# Patient Record
Sex: Female | Born: 1950 | Race: White | Hispanic: No | Marital: Married | Smoking: Never smoker
Health system: Southern US, Community
[De-identification: ages and names within clinical notes are randomized; demographics above are authoritative.]

## PROBLEM LIST (undated history)

## (undated) DIAGNOSIS — M1990 Unspecified osteoarthritis, unspecified site: Secondary | ICD-10-CM

## (undated) DIAGNOSIS — I739 Peripheral vascular disease, unspecified: Secondary | ICD-10-CM

## (undated) DIAGNOSIS — J45909 Unspecified asthma, uncomplicated: Secondary | ICD-10-CM

## (undated) DIAGNOSIS — R51 Headache: Secondary | ICD-10-CM

## (undated) DIAGNOSIS — R519 Headache, unspecified: Secondary | ICD-10-CM

## (undated) HISTORY — PX: TONSILLECTOMY: SUR1361

## (undated) HISTORY — PX: OTHER SURGICAL HISTORY: SHX169

## (undated) HISTORY — PX: WISDOM TOOTH EXTRACTION: SHX21

---

## 2004-08-25 ENCOUNTER — Encounter (INDEPENDENT_AMBULATORY_CARE_PROVIDER_SITE_OTHER): Payer: Self-pay | Admitting: Internal Medicine

## 2004-08-25 LAB — CONVERTED CEMR LAB: Pap Smear: NORMAL

## 2005-05-08 ENCOUNTER — Ambulatory Visit: Payer: Self-pay | Admitting: Family Medicine

## 2005-07-15 ENCOUNTER — Ambulatory Visit: Payer: Self-pay | Admitting: Family Medicine

## 2005-08-13 ENCOUNTER — Ambulatory Visit: Payer: Self-pay | Admitting: Family Medicine

## 2005-08-25 ENCOUNTER — Encounter (INDEPENDENT_AMBULATORY_CARE_PROVIDER_SITE_OTHER): Payer: Self-pay | Admitting: Internal Medicine

## 2005-08-25 LAB — CONVERTED CEMR LAB: Pap Smear: NORMAL

## 2005-08-29 ENCOUNTER — Ambulatory Visit: Payer: Self-pay | Admitting: Family Medicine

## 2005-08-29 ENCOUNTER — Other Ambulatory Visit: Admission: RE | Admit: 2005-08-29 | Discharge: 2005-08-29 | Payer: Self-pay | Admitting: Family Medicine

## 2005-08-29 ENCOUNTER — Encounter: Payer: Self-pay | Admitting: Family Medicine

## 2005-09-04 ENCOUNTER — Ambulatory Visit: Payer: Self-pay | Admitting: Family Medicine

## 2005-09-19 ENCOUNTER — Encounter: Admission: RE | Admit: 2005-09-19 | Discharge: 2005-09-19 | Payer: Self-pay | Admitting: Family Medicine

## 2005-12-16 ENCOUNTER — Ambulatory Visit: Payer: Self-pay | Admitting: Family Medicine

## 2006-01-08 ENCOUNTER — Ambulatory Visit: Payer: Self-pay | Admitting: Family Medicine

## 2006-05-14 ENCOUNTER — Ambulatory Visit: Payer: Self-pay | Admitting: Family Medicine

## 2006-05-26 ENCOUNTER — Ambulatory Visit: Payer: Self-pay | Admitting: Family Medicine

## 2006-06-02 ENCOUNTER — Ambulatory Visit: Payer: Self-pay | Admitting: Internal Medicine

## 2006-10-24 ENCOUNTER — Encounter (INDEPENDENT_AMBULATORY_CARE_PROVIDER_SITE_OTHER): Payer: Self-pay | Admitting: Internal Medicine

## 2006-10-24 LAB — CONVERTED CEMR LAB: Pap Smear: NORMAL

## 2006-10-28 ENCOUNTER — Encounter: Payer: Self-pay | Admitting: Family Medicine

## 2006-10-28 ENCOUNTER — Other Ambulatory Visit: Admission: RE | Admit: 2006-10-28 | Discharge: 2006-10-28 | Payer: Self-pay | Admitting: Family Medicine

## 2006-10-28 ENCOUNTER — Ambulatory Visit: Payer: Self-pay | Admitting: Family Medicine

## 2006-10-28 LAB — CONVERTED CEMR LAB
ALT: 41 units/L — ABNORMAL HIGH (ref 0–40)
AST: 27 units/L (ref 0–37)
Albumin: 3.3 g/dL — ABNORMAL LOW (ref 3.5–5.2)
Alkaline Phosphatase: 106 units/L (ref 39–117)
BUN: 12 mg/dL (ref 6–23)
Basophils Absolute: 0 10*3/uL (ref 0.0–0.1)
Basophils Relative: 0.5 % (ref 0.0–1.0)
Bilirubin, Direct: 0.1 mg/dL (ref 0.0–0.3)
CO2: 30 meq/L (ref 19–32)
Calcium: 8.9 mg/dL (ref 8.4–10.5)
Chloride: 107 meq/L (ref 96–112)
Creatinine, Ser: 0.7 mg/dL (ref 0.4–1.2)
Direct LDL: 111.1 mg/dL
Eosinophils Absolute: 0.1 10*3/uL (ref 0.0–0.6)
Eosinophils Relative: 1.2 % (ref 0.0–5.0)
GFR calc Af Amer: 112 mL/min
GFR calc non Af Amer: 92 mL/min
Glucose, Bld: 102 mg/dL — ABNORMAL HIGH (ref 70–99)
HCT: 40.8 % (ref 36.0–46.0)
Hemoglobin: 14.1 g/dL (ref 12.0–15.0)
Lymphocytes Relative: 25.6 % (ref 12.0–46.0)
MCHC: 34.6 g/dL (ref 30.0–36.0)
MCV: 87.1 fL (ref 78.0–100.0)
Monocytes Absolute: 0.3 10*3/uL (ref 0.2–0.7)
Monocytes Relative: 5.8 % (ref 3.0–11.0)
Neutro Abs: 3.6 10*3/uL (ref 1.4–7.7)
Neutrophils Relative %: 66.9 % (ref 43.0–77.0)
Platelets: 221 10*3/uL (ref 150–400)
Potassium: 4.2 meq/L (ref 3.5–5.1)
RBC: 4.68 M/uL (ref 3.87–5.11)
RDW: 12.3 % (ref 11.5–14.6)
Sodium: 143 meq/L (ref 135–145)
TSH: 1.18 microintl units/mL (ref 0.35–5.50)
Total Bilirubin: 0.9 mg/dL (ref 0.3–1.2)
Total Protein: 6.8 g/dL (ref 6.0–8.3)
Vit D, 1,25-Dihydroxy: 38 (ref 20–57)
WBC: 5.4 10*3/uL (ref 4.5–10.5)

## 2006-11-04 ENCOUNTER — Encounter: Admission: RE | Admit: 2006-11-04 | Discharge: 2006-11-04 | Payer: Self-pay | Admitting: Family Medicine

## 2006-11-17 ENCOUNTER — Ambulatory Visit: Payer: Self-pay | Admitting: Family Medicine

## 2006-11-18 ENCOUNTER — Encounter: Admission: RE | Admit: 2006-11-18 | Discharge: 2006-11-18 | Payer: Self-pay | Admitting: Family Medicine

## 2006-11-23 ENCOUNTER — Encounter (INDEPENDENT_AMBULATORY_CARE_PROVIDER_SITE_OTHER): Payer: Self-pay | Admitting: Internal Medicine

## 2006-11-23 DIAGNOSIS — N6019 Diffuse cystic mastopathy of unspecified breast: Secondary | ICD-10-CM | POA: Insufficient documentation

## 2006-11-23 DIAGNOSIS — J45909 Unspecified asthma, uncomplicated: Secondary | ICD-10-CM | POA: Insufficient documentation

## 2006-11-23 DIAGNOSIS — J309 Allergic rhinitis, unspecified: Secondary | ICD-10-CM | POA: Insufficient documentation

## 2006-11-23 DIAGNOSIS — B009 Herpesviral infection, unspecified: Secondary | ICD-10-CM | POA: Insufficient documentation

## 2007-01-19 ENCOUNTER — Encounter: Payer: Self-pay | Admitting: Family Medicine

## 2007-01-22 ENCOUNTER — Encounter: Payer: Self-pay | Admitting: Family Medicine

## 2007-02-22 ENCOUNTER — Encounter: Payer: Self-pay | Admitting: Family Medicine

## 2007-07-02 ENCOUNTER — Ambulatory Visit: Payer: Self-pay | Admitting: Family Medicine

## 2007-07-06 ENCOUNTER — Ambulatory Visit: Payer: Self-pay | Admitting: Internal Medicine

## 2007-07-08 ENCOUNTER — Emergency Department (HOSPITAL_COMMUNITY): Admission: EM | Admit: 2007-07-08 | Discharge: 2007-07-08 | Payer: Self-pay | Admitting: Emergency Medicine

## 2007-07-26 ENCOUNTER — Ambulatory Visit: Payer: Self-pay | Admitting: Internal Medicine

## 2007-07-26 ENCOUNTER — Telehealth (INDEPENDENT_AMBULATORY_CARE_PROVIDER_SITE_OTHER): Payer: Self-pay | Admitting: *Deleted

## 2007-11-22 ENCOUNTER — Ambulatory Visit: Payer: Self-pay | Admitting: Internal Medicine

## 2007-12-01 ENCOUNTER — Ambulatory Visit: Payer: Self-pay | Admitting: Family Medicine

## 2007-12-01 DIAGNOSIS — M949 Disorder of cartilage, unspecified: Secondary | ICD-10-CM

## 2007-12-01 DIAGNOSIS — M899 Disorder of bone, unspecified: Secondary | ICD-10-CM | POA: Insufficient documentation

## 2007-12-06 ENCOUNTER — Encounter: Admission: RE | Admit: 2007-12-06 | Discharge: 2007-12-06 | Payer: Self-pay | Admitting: Family Medicine

## 2007-12-07 LAB — CONVERTED CEMR LAB
ALT: 29 units/L (ref 0–35)
AST: 24 units/L (ref 0–37)
Albumin: 3.9 g/dL (ref 3.5–5.2)
Alkaline Phosphatase: 91 units/L (ref 39–117)
BUN: 19 mg/dL (ref 6–23)
Basophils Absolute: 0 10*3/uL (ref 0.0–0.1)
Basophils Relative: 0.2 % (ref 0.0–1.0)
Bilirubin, Direct: 0.1 mg/dL (ref 0.0–0.3)
CO2: 30 meq/L (ref 19–32)
Calcium: 9.1 mg/dL (ref 8.4–10.5)
Chloride: 109 meq/L (ref 96–112)
Creatinine, Ser: 0.9 mg/dL (ref 0.4–1.2)
Eosinophils Absolute: 0.1 10*3/uL (ref 0.0–0.7)
Eosinophils Relative: 1.2 % (ref 0.0–5.0)
GFR calc Af Amer: 83 mL/min
GFR calc non Af Amer: 69 mL/min
Glucose, Bld: 95 mg/dL (ref 70–99)
HCT: 39.1 % (ref 36.0–46.0)
Hemoglobin: 13.2 g/dL (ref 12.0–15.0)
Lymphocytes Relative: 28.6 % (ref 12.0–46.0)
MCHC: 33.7 g/dL (ref 30.0–36.0)
MCV: 86.1 fL (ref 78.0–100.0)
Monocytes Absolute: 0.4 10*3/uL (ref 0.1–1.0)
Monocytes Relative: 7.6 % (ref 3.0–12.0)
Neutro Abs: 3.4 10*3/uL (ref 1.4–7.7)
Neutrophils Relative %: 62.4 % (ref 43.0–77.0)
Platelets: 206 10*3/uL (ref 150–400)
Potassium: 4.2 meq/L (ref 3.5–5.1)
RBC: 4.54 M/uL (ref 3.87–5.11)
RDW: 12.5 % (ref 11.5–14.6)
Sodium: 143 meq/L (ref 135–145)
TSH: 1 microintl units/mL (ref 0.35–5.50)
Total Bilirubin: 0.6 mg/dL (ref 0.3–1.2)
Total Protein: 6.7 g/dL (ref 6.0–8.3)
WBC: 5.5 10*3/uL (ref 4.5–10.5)

## 2007-12-10 ENCOUNTER — Encounter (INDEPENDENT_AMBULATORY_CARE_PROVIDER_SITE_OTHER): Payer: Self-pay | Admitting: *Deleted

## 2008-01-25 ENCOUNTER — Telehealth: Payer: Self-pay | Admitting: Family Medicine

## 2008-01-28 ENCOUNTER — Telehealth: Payer: Self-pay | Admitting: Family Medicine

## 2008-02-02 ENCOUNTER — Encounter: Admission: RE | Admit: 2008-02-02 | Discharge: 2008-02-02 | Payer: Self-pay | Admitting: Family Medicine

## 2008-02-11 ENCOUNTER — Encounter (INDEPENDENT_AMBULATORY_CARE_PROVIDER_SITE_OTHER): Payer: Self-pay | Admitting: *Deleted

## 2008-05-25 ENCOUNTER — Ambulatory Visit: Payer: Self-pay | Admitting: Family Medicine

## 2008-05-25 DIAGNOSIS — M722 Plantar fascial fibromatosis: Secondary | ICD-10-CM | POA: Insufficient documentation

## 2008-06-06 ENCOUNTER — Ambulatory Visit: Payer: Self-pay | Admitting: Family Medicine

## 2008-07-17 ENCOUNTER — Ambulatory Visit: Payer: Self-pay | Admitting: Family Medicine

## 2009-10-30 ENCOUNTER — Other Ambulatory Visit: Admission: RE | Admit: 2009-10-30 | Discharge: 2009-10-30 | Payer: Self-pay | Admitting: Family Medicine

## 2009-10-30 ENCOUNTER — Ambulatory Visit: Payer: Self-pay | Admitting: Family Medicine

## 2009-10-30 DIAGNOSIS — K219 Gastro-esophageal reflux disease without esophagitis: Secondary | ICD-10-CM | POA: Insufficient documentation

## 2009-10-30 DIAGNOSIS — M25569 Pain in unspecified knee: Secondary | ICD-10-CM | POA: Insufficient documentation

## 2009-10-30 DIAGNOSIS — M79606 Pain in leg, unspecified: Secondary | ICD-10-CM | POA: Insufficient documentation

## 2009-10-30 DIAGNOSIS — M79609 Pain in unspecified limb: Secondary | ICD-10-CM | POA: Insufficient documentation

## 2009-10-31 ENCOUNTER — Encounter (INDEPENDENT_AMBULATORY_CARE_PROVIDER_SITE_OTHER): Payer: Self-pay | Admitting: *Deleted

## 2009-10-31 LAB — CONVERTED CEMR LAB
ALT: 36 units/L — ABNORMAL HIGH (ref 0–35)
AST: 25 units/L (ref 0–37)
Albumin: 4 g/dL (ref 3.5–5.2)
Alkaline Phosphatase: 110 units/L (ref 39–117)
BUN: 17 mg/dL (ref 6–23)
Basophils Absolute: 0.1 10*3/uL (ref 0.0–0.1)
Basophils Relative: 1 % (ref 0.0–3.0)
Bilirubin, Direct: 0 mg/dL (ref 0.0–0.3)
CO2: 31 meq/L (ref 19–32)
Calcium: 9.4 mg/dL (ref 8.4–10.5)
Chloride: 110 meq/L (ref 96–112)
Cholesterol: 199 mg/dL (ref 0–200)
Creatinine, Ser: 0.9 mg/dL (ref 0.4–1.2)
Eosinophils Absolute: 0.1 10*3/uL (ref 0.0–0.7)
Eosinophils Relative: 0.9 % (ref 0.0–5.0)
GFR calc non Af Amer: 68.29 mL/min (ref >60)
Glucose, Bld: 92 mg/dL (ref 70–99)
HCT: 40.9 % (ref 36.0–46.0)
HDL: 47.3 mg/dL (ref >39.00)
Hemoglobin: 13.6 g/dL (ref 12.0–15.0)
LDL Cholesterol: 124 mg/dL — ABNORMAL HIGH (ref 0–99)
Lymphocytes Relative: 23.2 % (ref 12.0–46.0)
Lymphs Abs: 1.3 10*3/uL (ref 0.7–4.0)
MCHC: 33.2 g/dL (ref 30.0–36.0)
MCV: 90.5 fL (ref 78.0–100.0)
Monocytes Absolute: 0.3 10*3/uL (ref 0.1–1.0)
Monocytes Relative: 6.2 % (ref 3.0–12.0)
Neutro Abs: 3.8 10*3/uL (ref 1.4–7.7)
Neutrophils Relative %: 68.7 % (ref 43.0–77.0)
Platelets: 178 10*3/uL (ref 150.0–400.0)
Potassium: 4.7 meq/L (ref 3.5–5.1)
RBC: 4.51 M/uL (ref 3.87–5.11)
RDW: 12.2 % (ref 11.5–14.6)
Sodium: 144 meq/L (ref 135–145)
TSH: 0.78 microintl units/mL (ref 0.35–5.50)
Total Bilirubin: 0.5 mg/dL (ref 0.3–1.2)
Total CHOL/HDL Ratio: 4
Total Protein: 6.9 g/dL (ref 6.0–8.3)
Triglycerides: 138 mg/dL (ref 0.0–149.0)
VLDL: 27.6 mg/dL (ref 0.0–40.0)
Vit D, 25-Hydroxy: 29 ng/mL — ABNORMAL LOW (ref 30–89)
WBC: 5.6 10*3/uL (ref 4.5–10.5)

## 2009-11-05 ENCOUNTER — Encounter (INDEPENDENT_AMBULATORY_CARE_PROVIDER_SITE_OTHER): Payer: Self-pay | Admitting: *Deleted

## 2009-11-05 LAB — CONVERTED CEMR LAB: Pap Smear: NEGATIVE

## 2009-11-06 ENCOUNTER — Encounter: Admission: RE | Admit: 2009-11-06 | Discharge: 2009-11-06 | Payer: Self-pay | Admitting: Family Medicine

## 2009-11-08 ENCOUNTER — Encounter: Payer: Self-pay | Admitting: Family Medicine

## 2010-09-15 ENCOUNTER — Encounter: Payer: Self-pay | Admitting: Family Medicine

## 2010-09-24 NOTE — Assessment & Plan Note (Signed)
Summary: sinus infec?/bir  Medications Added FLONASE 50 MCG/ACT SUSP (FLUTICASONE PROPIONATE) Spray 1 spray into both nostrils twice a day BONIVA 150 MG TABS (IBANDRONATE SODIUM) Take 1 tablet by mouth once a month DENAVIR 1 % CREA (PENCICLOVIR) Apply a small amount to affected area every three hours while awake ZYRTEC ALLERGY 10 MG  TABS (CETIRIZINE HCL) as needed BALANCED CARE   TABS (MULTIPLE VITAMINS-MINERALS) Take 1 tablet by mouth once a day AUGMENTIN 875-125 MG  TABS (AMOXICILLIN-POT CLAVULANATE) Take 1 tab by mouth twice a day x 10 days.      Allergies Added: NKDA  Vital Signs:  Patient Profile:   60 Years Old Female Weight:      177 pounds Temp:     97.9 degrees F oral Pulse rate:   75 / minute Resp:     18 per minute BP sitting:   121 / 86  (left arm) Cuff size:   regular  Vitals Entered By: Cooper Render (July 06, 2007 2:06 PM)                 Visit Type:  Acute PCP:  [facility]  Chief Complaint:  sinus pain & pressure.  History of Present Illness: Patient has reported that she became symptomatic approx. 2 weeks ago. First thing she noticed was she had alot of congestion, especially in her head. She then had thick yellowish drainage to start which has not responded to the Flonase and Zyrtec that she has used.  Continues to have thick yellow drainage. Had a cough until 2 days ago and now better.  Has used her Zyrtec and Flonase nasal spray. This morning she had increased pressure and it felt like someone was poking her in the eye, OS. The pressure is frontal and maxillary areas.  Current Allergies (reviewed today): No known allergies  Updated/Current Medications (including changes made in today's visit):  FLONASE 50 MCG/ACT SUSP (FLUTICASONE PROPIONATE) Spray 1 spray into both nostrils twice a day BONIVA 150 MG TABS (IBANDRONATE SODIUM) Take 1 tablet by mouth once a month DENAVIR 1 % CREA (PENCICLOVIR) Apply a small amount to affected area every three hours  while awake ZYRTEC ALLERGY 10 MG  TABS (CETIRIZINE HCL) as needed BALANCED CARE   TABS (MULTIPLE VITAMINS-MINERALS) Take 1 tablet by mouth once a day AUGMENTIN 875-125 MG  TABS (AMOXICILLIN-POT CLAVULANATE) Take 1 tab by mouth twice a day x 10 days.   Past Medical History:    Reviewed history from 11/23/2006 and no changes required:       Asthma  Past Surgical History:    Tonsillectomy    Dexa--08/2005, osteopenia    Cesarean section X 2    Right knee- arthroscopy- April 2008   Family History:    Father with hx of DM, HTN and obesity.    Mother with hx of osteoporosis (severe)    Paternal side of family + for heart problems and obesity.    Cousin died with leukemia.    Maternal g'ma with some type of cancer        Overall + hx for CV and DM.  Social History:    Married with two sons in their 20's.    Plays golf for hobby.    Walks some, too.    Review of Systems  The patient denies decreased hearing, hoarseness, chest pain, syncope, dyspnea on exhertion, peripheral edema, prolonged cough, abdominal pain, and severe indigestion/heartburn.         Does complain of chills that  come and go for the past 48 hours.   Physical Exam  General:     alert, well-developed, well-nourished, well-hydrated, appropriate dress, normal appearance, healthy-appearing, cooperative to examination, and good hygiene.   Ears:     Left TM is dull and light reflex is distorted. There is errythema noted in distal canal. No drainage and the right TM is negative. Nose:     Edematous turbinates bilaterally. There is also errythema noted. Yellowish, thick drainage. Mouth:     Posterior pharnyx is errythematous and there is + post-nasal drainage. No oral lesions. Neck:     supple and full ROM.   Lungs:     normal respiratory effort, no accessory muscle use, and normal breath sounds.   Heart:     normal rate and regular rhythm.   Skin:     turgor normal, color normal, no rashes, and no suspicious  lesions.   Cervical Nodes:     no anterior cervical adenopathy and no posterior cervical adenopathy.   Psych:     Oriented X3, memory intact for recent and remote, normally interactive, good eye contact, not anxious appearing, and not depressed appearing.      Impression & Recommendations:  Problem # 1:  OTITIS EXTERNA (ICD-380.10) Augmentin 875 mg twice a day x 10 days. Yogurt twice a day while on the antibiotics.  Problem # 2:  ACUTE SINUSITIS, UNSPECIFIED (ICD-461.9) Patient is taking Zyrtec 10 mg by mouth every day, as well as using the Flonase. Continue as above with Flonase and Zyrtec and the abx ordered above. Her updated medication list for this problem includes:    Flonase 50 Mcg/act Susp (Fluticasone propionate) ..... Spray 1 spray into both nostrils twice a day    Augmentin 875-125 Mg Tabs (Amoxicillin-pot clavulanate) .Marland Kitchen... Take 1 tab by mouth twice a day x 10 days.   Complete Medication List: 1)  Flonase 50 Mcg/act Susp (Fluticasone propionate) .... Spray 1 spray into both nostrils twice a day 2)  Boniva 150 Mg Tabs (Ibandronate sodium) .... Take 1 tablet by mouth once a month 3)  Denavir 1 % Crea (Penciclovir) .... Apply a small amount to affected area every three hours while awake 4)  Zyrtec Allergy 10 Mg Tabs (Cetirizine hcl) .... As needed 5)  Balanced Care Tabs (Multiple vitamins-minerals) .... Take 1 tablet by mouth once a day 6)  Augmentin 875-125 Mg Tabs (Amoxicillin-pot clavulanate) .... Take 1 tab by mouth twice a day x 10 days.   Patient Instructions: 1)  Augmentin 875 mg by mouth twice a day x 10 days. 2)  Continue the Zyrtec and the Flonase as currently doing. 3)  Yogurt twice a day while on the abx. 4)  Call if sx's not improving by first of the week.    Prescriptions: AUGMENTIN 875-125 MG  TABS (AMOXICILLIN-POT CLAVULANATE) Take 1 tab by mouth twice a day x 10 days.  #20 x 0   Entered and Authorized by:   Kathie Rhodes NP   Signed by:    Kathie Rhodes NP on 07/06/2007   Method used:   Print then Give to Patient   RxID:   [id number]  ]

## 2010-09-24 NOTE — Miscellaneous (Signed)
Summary: ass knee sx to list   Clinical Lists Changes  Problems: Added new problem of * R KNEE ARTHROSCOPIC SX 5/08

## 2010-09-24 NOTE — Consult Note (Signed)
Summary: Consultation Report  Consultation Report   Imported By: Mickle Asper 01/21/2007 16:52:34  _____________________________________________________________________  External Attachment:    Type:   Image     Comment:   External Document

## 2010-09-24 NOTE — Assessment & Plan Note (Signed)
Summary: LEFT FOOT PAIN & LEFT RIB / LFW   Vital Signs:  Patient Profile:   60 Years Old Female Height:     62.75 inches Weight:      182 pounds BMI:     32.61 Temp:     97.8 degrees F oral Pulse rate:   84 / minute Pulse rhythm:   regular BP sitting:   120 / 84  (left arm) Cuff size:   regular  Vitals Entered By: Liane Comber (July 17, 2008 3:07 PM)                 PCP:  Dany Harten  Chief Complaint:  L foot paiin.  History of Present Illness: L foot pain  started about a month ago  was plantar fasciitis in her heel--pain after being off of for a while (that part of it is better overall) now her 4 th toe is somewhat numb uncomfortable to walk like "stick in her shoe"-- is bothersome  no trauma  no orthotics or inserts in shoes occ takes tylenol or aleve- not much help  does not bother her at night  other foot is fine  no back pain at all  no swelling or redness of foot   also 2 weeks ago - rib pain after pulling some weeds - under bra line  is gradually gettting better - no longer hurts to take deep breath   otherwise is doing ok         Current Allergies: No known allergies   Past Medical History:    Reviewed history from 05/25/2008 and no changes required:       Asthma       allergies       osteopenia       fever blisters   Past Surgical History:    Reviewed history from 05/25/2008 and no changes required:       Tonsillectomy       Dexa--08/2005, osteopenia       Cesarean section X 2       Right knee- arthroscopy- April 2008       ganglion/ wrist    Family History:    Reviewed history from 07/06/2007 and no changes required:       Father with hx of DM, HTN and obesity.       Mother with hx of osteoporosis (severe)       Paternal side of family + for heart problems and obesity.       Cousin died with leukemia.       Maternal g'ma with some type of cancer              Overall + hx for CV and DM.  Social History:    Reviewed history  from 12/01/2007 and no changes required:       Married with two sons in their 20's.       Plays golf for hobby.       Walks some, too.       non smoker       occ alcohol       some caffine    Review of Systems  General      Denies fatigue, fever, loss of appetite, and malaise.  Eyes      Denies blurring and eye pain.  CV      Denies chest pain or discomfort, palpitations, shortness of breath with exertion, and swelling of feet.  Resp  Denies cough and wheezing.  GI      Denies abdominal pain and change in bowel habits.  MS      Complains of joint pain and stiffness.      Denies joint redness, joint swelling, and cramps.  Derm      Denies itching, lesion(s), and rash.  Neuro      Complains of numbness and tingling.      Denies weakness.  Heme      Denies abnormal bruising and bleeding.   Physical Exam  General:     overweight but generally well appearing  Head:     normocephalic, atraumatic, and no abnormalities observed.   Eyes:     vision grossly intact, pupils equal, pupils round, and pupils reactive to light.   Neck:     supple with full rom and no masses or thyromegally, no JVD or carotid bruit  Chest Wall:     slt tenderness L lateral lower ribs no crepitice Lungs:     Normal respiratory effort, chest expands symmetrically. Lungs are clear to auscultation, no crackles or wheezes. Heart:     Normal rate and regular rhythm. S1 and S2 normal without gallop, murmur, click, rub or other extra sounds. Abdomen:     soft, non-tender, and normal bowel sounds.   Msk:     mildly tender  over base of 4th toe with slt dec to soft touch sensation nl rom  no swelling or ecchymosis  no CVA tenderness  Pulses:     R and L carotid,radial,femoral,dorsalis pedis and posterior tibial pulses are full and equal bilaterally Extremities:     No clubbing, cyanosis, edema, or deformity noted with normal full range of motion of all joints.   Neurologic:      decreased sens to soft touch (but not sensation) over lat toes of L foot  strength normal in all extremities, gait normal, and DTRs symmetrical and norma no tremor l.   Skin:     Intact without suspicious lesions or rashes Cervical Nodes:     No lymphadenopathy noted Inguinal Nodes:     No significant adenopathy Psych:     normal affect, talkative and pleasant     Impression & Recommendations:  Problem # 1:  FOOT PAIN, LEFT (ICD-729.5) Assessment: New with numb sensation and tenderness at base of 4th toe  gait is unaffected  differential includes neuroma/ stress fx or advancing plantar fasciitis  ref to podiatry for films and further eval Orders: Podiatry Referral (Podiatry)   Problem # 2:  CHEST WALL PAIN, ACUTE (UEA-540.98) Assessment: New improved since a strain (with twisting) disc imp of deep breaths- to avoid atelectasis/pneumonia benign exam today adv to update if not resolved in 1-2 weeks heat/ nsaids as needed with caution  Complete Medication List: 1)  Flonase 50 Mcg/act Susp (Fluticasone propionate) .... Spray 1 spray into both nostrils twice a day 2)  Boniva 150 Mg Tabs (Ibandronate sodium) .... Take 1 tablet by mouth once a month 3)  Denavir 1 % Crea (Penciclovir) .... Apply a small amount to affected area every three hours while awake 4)  Zyrtec Allergy 10 Mg Tabs (Cetirizine hcl) .... As needed 5)  Balanced Care Tabs (Multiple vitamins-minerals) .... Take 1 tablet by mouth once a day 6)  Glucosamine Chondroitin Adv Tabs (Misc natural products) .... Daily   Patient Instructions: 1)  aleve with food is ok for pain as needed  2)  we will refer you to podiatry at check  out  3)  update me if rib pain increases or you get short of breath   ]

## 2010-09-24 NOTE — Miscellaneous (Signed)
Summary: dexa results  Clinical Lists Changes  Observations: Added new observation of BONE DENSITY: abnormal (02/11/2008 9:53)       Preventive Care Screening  Bone Density:    Date:  02/11/2008    Results:  abnormal

## 2010-09-24 NOTE — Assessment & Plan Note (Signed)
Summary: [facility] FLU SHOT/[facility]   Nurse Visit        Orders Added: 1)  Flu Vaccine 59yrs + [90658] 2)  Admin 1st Vaccine [email or web address]    ]  Influenza Vaccine    Vaccine Type: Fluvax 3+    Site: right deltoid    Mfr: Sanofi Pasteur    Dose: 0.5 ml    Route: IM    Given by: Providence Crosby    Exp. Date: 02/22/2008    Lot #: [id number]    VIS given: 02/21/05 version given July 02, 2007.  Flu Vaccine Consent Questions    Do you have a history of severe allergic reactions to this vaccine? no    Any prior history of allergic reactions to egg and/or gelatin? no    Do you have a sensitivity to the preservative Thimersol? no    Do you have a past history of Guillan-Barre Syndrome? no    Do you currently have an acute febrile illness? no    Have you ever had a severe reaction to latex? no    Vaccine information given and explained to patient? yes    Are you currently pregnant? no

## 2010-09-24 NOTE — Progress Notes (Signed)
Summary: Rx Boniva  Phone Note Call from Patient Call back at [phone]   Caller: Patient Call For: Dr. Milinda Antis Summary of Call: Pt has been prescirbed Boniva.  Pt's husband is in the process of changing jobs and his insurance will change.  Pt would like a rx for Boniva for a month supply to take to a local pharmacy.  Pt has a rx for a 3 month supply but does not want to send this to [facility] because insurance will be changing.  Pt wants to take the [facility] to [facility] Initial call taken by: Sydell Axon,  January 25, 2008 12:02 PM  Follow-up for Phone Call        px written on EMR for call in  Follow-up by: Judith Part MD,  January 25, 2008 1:39 PM  Additional Follow-up for Phone Call Additional follow up Details #1::        [facility] Additional Follow-up by: Lowella Petties,  January 25, 2008 2:08 PM      Prescriptions: BONIVA 150 MG TABS (IBANDRONATE SODIUM) Take 1 tablet by mouth once a month  #1 x 3   Entered and Authorized by:   Judith Part MD   Signed by:   Lowella Petties on 01/25/2008   Method used:   Telephoned to ...         RxID:   [id number]

## 2010-09-24 NOTE — Miscellaneous (Signed)
  Clinical Lists Changes  Medications: Added new medication of VITAMIN D 1000 UNIT  TABS (CHOLECALCIFEROL) Take 2,000 international units every day

## 2010-09-24 NOTE — Assessment & Plan Note (Signed)
Summary: CPX / LFW   Vital Signs:  Patient Profile:   60 Years Old Female Height:     62.75 inches Weight:      181 pounds Temp:     98.1 degrees F oral Pulse rate:   72 / minute Pulse rhythm:   regular BP sitting:   110 / 70  (left arm) Cuff size:   large  Vitals Entered By: Lowella Petties (December 01, 2007 10:38 AM)                 PCP:  Milinda Antis  Chief Complaint:  30 minute check up.  History of Present Illness: has been doing well for the most part was recently tx for sinsus infection- took z pack-- is doing a lot better- green d/c is gone  no gyn probs at all nl pap last year  needs mam no lumps on self exam  has osteopenia also fam hx of OP taking her ca and vit D is doing well with boniva- back feels tired when she is due to take it is not exercising regularly- like she should be   some stress- husb job is unstable   does eat a pretty healthy diet  allergies are bad this year      Current Allergies: No known allergies   Past Medical History:    Asthma    allergies    osteopenia  Past Surgical History:    Reviewed history from 07/06/2007 and no changes required:       Tonsillectomy       Dexa--08/2005, osteopenia       Cesarean section X 2       Right knee- arthroscopy- April 2008   Social History:    Married with two sons in their 20's.    Plays golf for hobby.    Walks some, too.    non smoker    occ alcohol    some caffine   Risk Factors:  Colonoscopy History:     Date of Last Colonoscopy:  08/26/2003    Results:  normal    Review of Systems  General      Denies fever, loss of appetite, and malaise.  Eyes      Denies blurring.  ENT      Complains of nasal congestion and postnasal drainage.  CV      Denies chest pain or discomfort and palpitations.  Resp      Denies cough and shortness of breath.  GI      Denies bloody stools and change in bowel habits.  GU      Denies discharge and dysuria.  Derm      Denies  changes in color of skin, dryness, and rash.  Neuro      Denies numbness and tingling.  Psych      mood has been good overall  Endo      Denies excessive thirst and excessive urination.   Physical Exam  General:     overweight but generally well appearing  Head:     normocephalic, atraumatic, and no abnormalities observed.   Eyes:     vision grossly intact, pupils equal, pupils round, and pupils reactive to light.   Ears:     R ear normal and L ear normal.   Nose:     no nasal discharge and nasal dischargemucosal pallor.   Mouth:     pharynx pink and moist, no erythema, and no exudates.   Neck:  supple with full rom and no masses or thyromegally, no JVD or carotid bruit  Breasts:     No mass, nodules, thickening, tenderness, bulging, retraction, inflamation, nipple discharge or skin changes noted.   Lungs:     Normal respiratory effort, chest expands symmetrically. Lungs are clear to auscultation, no crackles or wheezes. Heart:     Normal rate and regular rhythm. S1 and S2 normal without gallop, murmur, click, rub or other extra sounds. Abdomen:     Bowel sounds positive,abdomen soft and non-tender without masses, organomegaly or hernias noted. Msk:     No deformity or scoliosis noted of thoracic or lumbar spine.  no acute joint changes Pulses:     R and L carotid,radial,femoral,dorsalis pedis and posterior tibial pulses are full and equal bilaterally Extremities:     No clubbing, cyanosis, edema, or deformity noted with normal full range of motion of all joints.   Neurologic:     sensation intact to light touch, gait normal, and DTRs symmetrical and normal.   Skin:     Intact without suspicious lesions or rashes some lentigos and solar changes Cervical Nodes:     No lymphadenopathy noted Axillary Nodes:     No palpable lymphadenopathy Inguinal Nodes:     No significant adenopathy Psych:     normal affect, talkative and pleasant     Impression &  Recommendations:  Problem # 1:  HEALTH MAINTENANCE EXAM (ICD-V70.0) Assessment: Comment Only reviewed health habits including diet, exercise and skin cancer prevention reviewed health maintenance list and family history  chol good in past- will skip this year and plan to check next year- pt also non fasting  Orders: Venipuncture ([address]) TLB-BMP (Basic Metabolic Panel-BMET) (80048-METABOL) TLB-CBC Platelet - w/Differential (85025-CBCD) TLB-Hepatic/Liver Function Pnl (80076-HEPATIC) TLB-TSH (Thyroid Stimulating Hormone) (84443-TSH)   Problem # 2:  OTHER SCREENING MAMMOGRAM (ICD-V76.12) Assessment: Comment Only will order mam enc monthly self breast exams Orders: Mammogram (Screening) (Mammo)   Problem # 3:  ALLERGIC RHINITIS (ICD-477.9) Assessment: Deteriorated refil flonase is doing ok overall- will update if sinusitis symptoms do not totally resolve Her updated medication list for this problem includes:    Flonase 50 Mcg/act Susp (Fluticasone propionate) ..... Spray 1 spray into both nostrils twice a day    Zyrtec Allergy 10 Mg Tabs (Cetirizine hcl) .Marland Kitchen... As needed   Problem # 4:  OSTEOPENIA (ICD-733.90) Assessment: Unchanged will need to plan next dexa within a year or 2 pt wants to hold off for now- and will call to schedule will continue boniva and disc optimal ca and vit D intake Her updated medication list for this problem includes:    Boniva 150 Mg Tabs (Ibandronate sodium) .Marland Kitchen... Take 1 tablet by mouth once a month   Complete Medication List: 1)  Flonase 50 Mcg/act Susp (Fluticasone propionate) .... Spray 1 spray into both nostrils twice a day 2)  Boniva 150 Mg Tabs (Ibandronate sodium) .... Take 1 tablet by mouth once a month 3)  Denavir 1 % Crea (Penciclovir) .... Apply a small amount to affected area every three hours while awake 4)  Zyrtec Allergy 10 Mg Tabs (Cetirizine hcl) .... As needed 5)  Balanced Care Tabs (Multiple vitamins-minerals) .... Take 1 tablet  by mouth once a day 6)  Zithromax Z-pak 250 Mg Tabs (Azithromycin) .... Take as directed..   Patient Instructions: 1)  the current recommendation for calcium intake is 1200-1500 mg daily with 502 145 2100 IU of vitamin D 2)  It is important that you  exercise regularly at least 20 minutes 5 times a week. If you develop chest pain, have severe difficulty breathing, or feel very tired , stop exercising immediately and seek medical attention. 3)  we will set up screening mammogram at check out  4)  keep working on a healthy diet     Prescriptions: DENAVIR 1 % CREA (PENCICLOVIR) Apply a small amount to affected area every three hours while awake  #3 months x 3   Entered and Authorized by:   Judith Part MD   Signed by:   Judith Part MD on 12/01/2007   Method used:   Print then Give to Patient   RxID:   [id number] BONIVA 150 MG TABS (IBANDRONATE SODIUM) Take 1 tablet by mouth once a month  #3 months x 3   Entered and Authorized by:   Judith Part MD   Signed by:   Judith Part MD on 12/01/2007   Method used:   Print then Give to Patient   RxID:   [id number] FLONASE 50 MCG/ACT SUSP (FLUTICASONE PROPIONATE) Spray 1 spray into both nostrils twice a day  #3 months x 3   Entered and Authorized by:   Judith Part MD   Signed by:   Judith Part MD on 12/01/2007   Method used:   Print then Give to Patient   RxID:   [id number]  ] Prior Medications (reviewed today): FLONASE 50 MCG/ACT SUSP (FLUTICASONE PROPIONATE) Spray 1 spray into both nostrils twice a day BONIVA 150 MG TABS (IBANDRONATE SODIUM) Take 1 tablet by mouth once a month DENAVIR 1 % CREA (PENCICLOVIR) Apply a small amount to affected area every three hours while awake ZYRTEC ALLERGY 10 MG  TABS (CETIRIZINE HCL) as needed BALANCED CARE   TABS (MULTIPLE VITAMINS-MINERALS) Take 1 tablet by mouth once a day ZITHROMAX Z-PAK 250 MG  TABS (AZITHROMYCIN) Take as directed.. Current Allergies: No known  allergies   Preventive Care Screening  Breast Exam:    Date:  12/01/2007    Results:  Normal  Bone Density:    Date:  08/25/2005    Results:  abnormal std dev  Colonoscopy:    Date:  08/26/2003    Results:  normal

## 2010-09-24 NOTE — Letter (Signed)
Summary: Results Follow up Letter  [facility]illa at [facility]  [address] [place], [place] [address]   Phone: [phone]  Fax: [phone]    12/10/2007 MRN: [id number]  [facility] [address] [place], [place]  [address]  Dear Ms. BROTT,  The following are the results of your recent test(s):  Test         Result    Pap Smear:        Normal _____  Not Normal _____ Comments: ______________________________________________________ Cholesterol: LDL(Bad cholesterol):         Your goal is less than:         HDL (Good cholesterol):       Your goal is more than: Comments:  ______________________________________________________ Mammogram:        Normal __X___  Not Normal _____ Comments:  ___________________________________________________________________ Hemoccult:        Normal _____  Not normal _______ Comments:    _____________________________________________________________________ Other Tests:    We routinely do not discuss normal results over the telephone.  If you desire a copy of the results, or you have any questions about this information we can discuss them at your next office visit.   Sincerely,   Dr. Milinda Antis

## 2010-09-24 NOTE — Assessment & Plan Note (Signed)
Summary: REFILL MEDICATIONS/CLE   Vital Signs:  Patient Profile:   60 Years Old Female Height:     62.75 inches Weight:      183 pounds Temp:     97.8 degrees F oral Pulse rate:   84 / minute Pulse rhythm:   regular BP sitting:   140 / 80  (right arm) Cuff size:   regular  Vitals Entered By: Liane Comber (May 25, 2008 9:24 AM)                 PCP:  Gael Delude  Chief Complaint:  med refills.  History of Present Illness: has been working a lot-- part time turned into full time  wants to eventually cut back on her hours  has been feeling ok overall   allergies kicked in- was in [place] -- and had a lot of symptoms  gets runny nose /tch eyes , sneezing (no wheezing ) is taking zyrtec - pm and D in am  no signs of sinus infection -- little stuffy  flonase helps too    no changes in med   bp is up a bit today  not enough exercise -- because of work hours   dexa shows osteopenia -- with slt imp spine and stable hip wants to stay on boniva and ca and D  had bad heel pain -- after a long walk-- now a little area feeling numb was swollen -- better now (used ice) , no redness -- is L foot       Current Allergies (reviewed today): No known allergies   Past Medical History:    Reviewed history from 12/01/2007 and no changes required:       Asthma       allergies       osteopenia       fever blisters   Past Surgical History:    Reviewed history from 07/06/2007 and no changes required:       Tonsillectomy       Dexa--08/2005, osteopenia       Cesarean section X 2       Right knee- arthroscopy- April 2008       ganglion/ wrist    Family History:    Reviewed history from 07/06/2007 and no changes required:       Father with hx of DM, HTN and obesity.       Mother with hx of osteoporosis (severe)       Paternal side of family + for heart problems and obesity.       Cousin died with leukemia.       Maternal g'ma with some type of cancer         Overall + hx for CV and DM.  Social History:    Reviewed history from 12/01/2007 and no changes required:       Married with two sons in their 20's.       Plays golf for hobby.       Walks some, too.       non smoker       occ alcohol       some caffine    Review of Systems  General      Denies fatigue, loss of appetite, malaise, and sleep disorder.  Eyes      Denies eye irritation.  CV      Denies chest pain or discomfort, lightheadness, and near fainting.  Resp      Denies cough  and wheezing.  GI      Denies abdominal pain, bloody stools, and change in bowel habits.  MS      Complains of joint pain.      Denies joint redness, joint swelling, cramps, muscle weakness, and stiffness.  Derm      Denies itching, lesion(s), and rash.  Neuro      Denies numbness and tingling.  Psych      mood is ok   Endo      Denies excessive thirst and excessive urination.  Heme      Denies abnormal bruising.   Physical Exam  General:     overweight but generally well appearing  Head:     normocephalic, atraumatic, and no abnormalities observed.  no sinus tenderness  Eyes:     vision grossly intact, pupils equal, pupils round, pupils reactive to light, and no injection.   Ears:     R ear normal and L ear normal.   Nose:     no nasal discharge.   Mouth:     pharynx pink and moist.   Neck:     supple with full rom and no masses or thyromegally, no JVD or carotid bruit  Chest Wall:     No deformities, masses, or tenderness noted. Lungs:     Normal respiratory effort, chest expands symmetrically. Lungs are clear to auscultation, no crackles or wheezes. Heart:     Normal rate and regular rhythm. S1 and S2 normal without gallop, murmur, click, rub or other extra sounds. Abdomen:     soft, non-tender, and normal bowel sounds.   Msk:     No deformity or scoliosis noted of thoracic or lumbar spine.  L heel and arch are generally tender-- without any dorsal bony  tenderness full rom of foot and ankle-- and ext is well perfused  Pulses:     R and L carotid,radial,femoral,dorsalis pedis and posterior tibial pulses are full and equal bilaterally Extremities:     No clubbing, cyanosis, edema, or deformity noted with normal full range of motion of all joints.   Neurologic:     sensation intact to light touch, gait normal, and DTRs symmetrical and normal.   Skin:     Intact without suspicious lesions or rashes Cervical Nodes:     No lymphadenopathy noted Psych:     normal affect, talkative and pleasant     Impression & Recommendations:  Problem # 1:  OSTEOPENIA (ICD-733.90) Assessment: Unchanged stable overall on dexa continue boniva and ca and D Her updated medication list for this problem includes:    Boniva 150 Mg Tabs (Ibandronate sodium) .Marland Kitchen... Take 1 tablet by mouth once a month   Problem # 2:  ALLERGIC RHINITIS (ICD-477.9) Assessment: Deteriorated continue reg use of meds -- until first freeze for ragweed all Her updated medication list for this problem includes:    Flonase 50 Mcg/act Susp (Fluticasone propionate) ..... Spray 1 spray into both nostrils twice a day    Zyrtec Allergy 10 Mg Tabs (Cetirizine hcl) .Marland Kitchen... 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 Spray 1 spray into both nostrils twice a day 2)  Boniva 150 Mg Tabs (Ibandronate  sodium) .... Take 1 tablet by mouth once a month 3)  Denavir 1 % Crea (Penciclovir) .... Apply a small amount to affected area every three hours while awake 4)  Zyrtec Allergy 10 Mg Tabs (Cetirizine hcl) .... As needed 5)  Balanced Care Tabs (Multiple vitamins-minerals) .... Take 1 tablet by  mouth once a day  Other Orders: Admin 1st Vaccine (13244) Flu Vaccine 97yrs + (01027)   Patient Instructions: 1)  wear shoes at all times when up -- hard soled preferably 2)  use ice -- roll foot over frozen can 5 min twice daily  3)  try alever 2 pills with food twice daily for 2 weeks (if GI upset stop it ) 4)  update me if heel pain is not greatly improved in 2 weeks -- would consider x ray 5)  no change in other medicines  6)  flu shot today   Prescriptions: DENAVIR 1 % CREA (PENCICLOVIR) Apply a small amount to affected area every three hours while awake  #1 small x 3   Entered and Authorized by:   Judith Part MD   Signed by:   Judith Part MD on 05/25/2008   Method used:   Print then Give to Patient   RxID:   [id number] BONIVA 150 MG TABS (IBANDRONATE SODIUM) Take 1 tablet by mouth once a month  #3 months x 3   Entered and Authorized by:   Judith Part MD   Signed by:   Judith Part MD on 05/25/2008   Method used:   Print then Give to Patient   RxID:   [id number] FLONASE 50 MCG/ACT SUSP (FLUTICASONE PROPIONATE) Spray 1 spray into both nostrils twice a day  #3 months x 3   Entered and Authorized by:   Judith Part MD   Signed by:   Judith Part MD on 05/25/2008   Method used:   Print then Give to Patient   RxID:   [id number]  ]       Flu Vaccine Consent Questions     Do you have a history of severe allergic reactions to this vaccine? no    Any prior history of allergic reactions to egg and/or gelatin? no    Do you have a sensitivity to the preservative Thimersol? no    Do you have a past history of Guillan-Barre Syndrome? no    Do you currently have an acute febrile illness? no    Have you ever had a severe reaction to latex? no    Vaccine information given and explained to patient? yes    Are you currently pregnant? no    Lot Number:[id number]   Site Given  Left Deltoid IMflu

## 2010-09-24 NOTE — Miscellaneous (Signed)
Summary: mammogram screening  Clinical Lists Changes  Observations: Added new observation of MAMMO DUE: 10/2010 (11/08/2009 15:33) Added new observation of MAMMOGRAM: normal (11/06/2009 15:34)      Preventive Care Screening  Mammogram:    Date:  11/06/2009    Next Due:  10/2010    Results:  normal

## 2010-09-24 NOTE — Assessment & Plan Note (Signed)
Summary: WATER IN LEFT EAR?/MK   Vital Signs:  Patient Profile:   60 Years Old Female Height:     62.75 inches Weight:      182 pounds Temp:     97.8 degrees F oral Pulse rate:   76 / minute Pulse rhythm:   regular BP sitting:   140 / 80  (left arm) Cuff size:   regular  Vitals Entered By: Liane Comber (June 06, 2008 9:25 AM)                 PCP:  [facility]  Chief Complaint:  water in ear.  History of Present Illness: L ear is stuffy on and off  yesterday- some pain behind her ear  no fever   allergies -- runny and stuffy nose  no green d/c -- but is yellow  no fever or feeling run down some cough -- is prod in am-- yellow  no throat pain or hoarsenss  some nausea- no dizziness   is on zyrtec -- occ the D version     Current Allergies (reviewed today): No known allergies   Past Medical History:    Reviewed history from 05/25/2008 and no changes required:       Asthma       allergies       osteopenia       fever blisters   Past Surgical History:    Reviewed history from 05/25/2008 and no changes required:       Tonsillectomy       Dexa--08/2005, osteopenia       Cesarean section X 2       Right knee- arthroscopy- April 2008       ganglion/ wrist    Family History:    Reviewed history from 07/06/2007 and no changes required:       Father with hx of DM, HTN and obesity.       Mother with hx of osteoporosis (severe)       Paternal side of family + for heart problems and obesity.       Cousin died with leukemia.       Maternal g'ma with some type of cancer              Overall + hx for CV and DM.  Social History:    Reviewed history from 12/01/2007 and no changes required:       Married with two sons in their 20's.       Plays golf for hobby.       Walks some, too.       non smoker       occ alcohol       some caffine    Review of Systems  General      Denies chills, fatigue, fever, loss of appetite, and malaise.  Eyes      Denies  discharge, eye irritation, and itching.  ENT      Complains of earache, nasal congestion, postnasal drainage, and sinus pressure.      Denies ear discharge, ringing in ears, and sore throat.  CV      Denies chest pain or discomfort, palpitations, and shortness of breath with exertion.  Resp      Complains of cough and sputum productive.      Denies pleuritic, shortness of breath, and wheezing.  GI      Complains of nausea.      Denies abdominal pain and vomiting.  Derm  Denies itching, lesion(s), and rash.  Neuro      Denies numbness, sensation of room spinning, and tingling.  Psych      mood is good    Physical Exam  General:     Well-developed,well-nourished,in no acute distress; alert,appropriate and cooperative throughout examination Head:     normocephalic, atraumatic, and no abnormalities observed.  no sinus tenderness  Eyes:     vision grossly intact, pupils equal, pupils round, pupils reactive to light, and no injection.   Ears:     L TM moderate effusion - no redness or bulging R TM - small eff canals clear  Nose:     nares injected and boggy Mouth:     Oral mucosa and oropharynx without lesions or exudates.  Teeth in good repair. Neck:     supple with full rom and no masses or thyromegally, no JVD or carotid bruit  Chest Wall:     No deformities, masses, or tenderness noted. Lungs:     Normal respiratory effort, chest expands symmetrically. Lungs are clear to auscultation, no crackles or wheezes. Heart:     Normal rate and regular rhythm. S1 and S2 normal without gallop, murmur, click, rub or other extra sounds. Skin:     Intact without suspicious lesions or rashes Cervical Nodes:     No lymphadenopathy noted Psych:     normal affect, talkative and pleasant     Impression & Recommendations:  Problem # 1:  EUSTACHIAN TUBE DYSFUNCTION (ICD-381.81) Assessment: New from allergies and cold will try to decongest-- use zyrtec D for 1 week and  inc flonase to 2 sprays (from one) in each nostril for 5 days  (then can go back on reg zyrtec plain and usual flonase dose (which will remain on EMR med list) can also add mucinex if not imp with above adv to update if ear pain or fever or not imp in 1 week    Problem # 2:  ALLERGIC RHINITIS (ICD-477.9) Assessment: Deteriorated see above  Her updated medication list for this problem includes:    Flonase 50 Mcg/act Susp (Fluticasone propionate) ..... Spray 1 spray into both nostrils twice a day    Zyrtec Allergy 10 Mg Tabs (Cetirizine hcl) .Marland Kitchen... As needed   Complete Medication List: 1)  Flonase 50 Mcg/act Susp (Fluticasone propionate) .... Spray 1 spray into both nostrils twice a day 2)  Boniva 150 Mg Tabs (Ibandronate sodium) .... Take 1 tablet by mouth once a month 3)  Denavir 1 % Crea (Penciclovir) .... Apply a small amount to affected area every three hours while awake 4)  Zyrtec Allergy 10 Mg Tabs (Cetirizine hcl) .... As needed 5)  Balanced Care Tabs (Multiple vitamins-minerals) .... Take 1 tablet by mouth once a day   Patient Instructions: 1)  you have some middle ear congestion/fluid from allergies and possible cold 2)  switch to zyrtec D for a week or less 3)  increase your flonase to 2 sprays in each nostril twice daily-- for 5 days  4)  you can also try plain mucinex over the counter as directed for congestion 5)  breathe steam 6)  warm compresses over sinuses  7)  update me if ear pain or fever or if not improved in 1 -2 weeks    ]

## 2010-09-24 NOTE — Assessment & Plan Note (Signed)
Summary: SINUS INFECTION/[facility]   Vital Signs:  Patient Profile:   60 Years Old Female Weight:      180 pounds Temp:     98.2 degrees F oral Pulse rate:   80 / minute Pulse rhythm:   regular Resp:     18 per minute BP sitting:   124 / 80  (left arm) Cuff size:   regular  Vitals Entered By: Lowella Petties (July 26, 2007 12:11 PM)                 Visit Type:  Acute PCP:  [facility]  Chief Complaint:  Sinus drainage.  History of Present Illness: Got well from the previous infection and then after awhile, sx's are back and has progressed to the full-blown thick greenish-yellow drainage and she feels miserable. Cough first thing in the mornings. Allergy meds: Flonase and Claritin.  Current Allergies (reviewed today): No known allergies  Updated/Current Medications (including changes made in today's visit):  FLONASE 50 MCG/ACT SUSP (FLUTICASONE PROPIONATE) Spray 1 spray into both nostrils twice a day BONIVA 150 MG TABS (IBANDRONATE SODIUM) Take 1 tablet by mouth once a month DENAVIR 1 % CREA (PENCICLOVIR) Apply a small amount to affected area every three hours while awake ZYRTEC ALLERGY 10 MG  TABS (CETIRIZINE HCL) as needed BALANCED CARE   TABS (MULTIPLE VITAMINS-MINERALS) Take 1 tablet by mouth once a day ZITHROMAX Z-PAK 250 MG  TABS (AZITHROMYCIN) Take as directed. Two tabs today and then 1 tab daily x 4 starting tomorrow.   Past Medical History:    Reviewed history from 11/23/2006 and no changes required:       Asthma     Review of Systems  The patient denies fever, decreased hearing, hoarseness, chest pain, syncope, dyspnea on exhertion, hemoptysis, abdominal pain, and severe indigestion/heartburn.         Nasal drainage which is thick and hard to cough up at times.   Physical Exam  General:     alert, well-developed, well-nourished, well-hydrated, appropriate dress, normal appearance, healthy-appearing, cooperative to examination, and good hygiene.   Ears:   TM's negative. Nose:     Turbinates are edematous and errythema noted as well. Mouth:     Posterior pharnyx is errythematous and there is + post-nasal drainage noted. Neck:     supple and full ROM.   Lungs:     normal respiratory effort, no accessory muscle use, and normal breath sounds.   Heart:     normal rate and regular rhythm.   Neurologic:     alert & oriented X3.   Skin:     turgor normal, color normal, no rashes, and no suspicious lesions.   Cervical Nodes:     no anterior cervical adenopathy and no posterior cervical adenopathy.   Psych:     memory intact for recent and remote, normally interactive, good eye contact, not anxious appearing, and not depressed appearing.      Impression & Recommendations:  Problem # 1:  ACUTE SINUSITIS, UNSPECIFIED (ICD-461.9) Zithromax 250 mg by mouth - Take 2 today and then 1 daily starting tomorrow x 4 days. The following medications were removed from the medication list:    Augmentin 875-125 Mg Tabs (Amoxicillin-pot clavulanate) .Marland Kitchen... Take 1 tab by mouth twice a day x 10 days.  Her updated medication list for this problem includes:    Flonase 50 Mcg/act Susp (Fluticasone propionate) ..... Spray 1 spray into both nostrils twice a day    Zithromax Z-pak  250 Mg Tabs (Azithromycin) .Marland Kitchen... Take as directed. two tabs today and then 1 tab daily x 4 starting tomorrow.   Complete Medication List: 1)  Flonase 50 Mcg/act Susp (Fluticasone propionate) .... Spray 1 spray into both nostrils twice a day 2)  Boniva 150 Mg Tabs (Ibandronate sodium) .... Take 1 tablet by mouth once a month 3)  Denavir 1 % Crea (Penciclovir) .... Apply a small amount to affected area every three hours while awake 4)  Zyrtec Allergy 10 Mg Tabs (Cetirizine hcl) .... As needed 5)  Balanced Care Tabs (Multiple vitamins-minerals) .... Take 1 tablet by mouth once a day 6)  Zithromax Z-pak 250 Mg Tabs (Azithromycin) .... Take as directed. two tabs today and then 1 tab daily x  4 starting tomorrow.   Patient Instructions: 1)  Zithromax 250 mg- Take two tabs today and then starting tomorrow, take 1 tab daily x 4 days. 2)  Continue with the Zyrtec and the Flonase.    Prescriptions: ZITHROMAX Z-PAK 250 MG  TABS (AZITHROMYCIN) Take as directed. Two tabs today and then 1 tab daily x 4 starting tomorrow.  #1 x 0   Entered and Authorized by:   Kathie Rhodes NP   Signed by:   Kathie Rhodes NP on 07/26/2007   Method used:   Print then Give to Patient   RxID:   [id number]  ]

## 2010-09-24 NOTE — Assessment & Plan Note (Signed)
Summary: EAR,CONGESTION,DRAINAGE/CLE   Vital Signs:  Patient Profile:   60 Years Old Female Weight:      180.38 pounds Temp:     97.6 degrees F oral Pulse rate:   80 / minute Resp:     14 per minute BP sitting:   122 / 80  (left arm)  Vitals Entered By: Wandra Mannan (November 22, 2007 12:03 PM)                 PCP:  [facility]  Chief Complaint:  ears, congestion, and drainage .  History of Present Illness: started about 10 days ago with what seemed to be allergies Now with more congestion Using zyrtec -D Now feels like fluid in her ear Only AM cough--some sputum No sore throat but is irritated Has had thick yellow nasal drainage No fever No sig SOB No sig otalgia  Yearly spring allergies    Current Allergies (reviewed today): No known allergies   Past Medical History:    Reviewed history from 11/23/2006 and no changes required:       Asthma  Past Surgical History:    Reviewed history from 07/06/2007 and no changes required:       Tonsillectomy       Dexa--08/2005, osteopenia       Cesarean section X 2       Right knee- arthroscopy- April 2008     Review of Systems       No headaches no rash No vomiting or diarrhea   Physical Exam  General:     alert.  NAD Head:     mild maxillary pressure Ears:     no inflammation No clear fluid but can't tell for sure Nose:     mod mostly pale congestion Mouth:     no erythema and no exudates.   Neck:     supple and no cervical lymphadenopathy.   Lungs:     normal respiratory effort, normal breath sounds, and no crackles.      Impression & Recommendations:  Problem # 1:  ACUTE SINUSITIS, UNSPECIFIED (ICD-461.9) Assessment: Comment Only not clearly bacterial but does seem to have some secondary infection on top of allergies continue supportive care If gets sick--try the antibiotic again  Her updated medication list for this problem includes:    Flonase 50 Mcg/act Susp (Fluticasone propionate) .....  Spray 1 spray into both nostrils twice a day    Zithromax Z-pak 250 Mg Tabs (Azithromycin) .Marland Kitchen... Take as directed..   Complete Medication List: 1)  Flonase 50 Mcg/act Susp (Fluticasone propionate) .... Spray 1 spray into both nostrils twice a day 2)  Boniva 150 Mg Tabs (Ibandronate sodium) .... Take 1 tablet by mouth once a month 3)  Denavir 1 % Crea (Penciclovir) .... Apply a small amount to affected area every three hours while awake 4)  Zyrtec Allergy 10 Mg Tabs (Cetirizine hcl) .... As needed 5)  Balanced Care Tabs (Multiple vitamins-minerals) .... Take 1 tablet by mouth once a day 6)  Zithromax Z-pak 250 Mg Tabs (Azithromycin) .... Take as directed..   Patient Instructions: 1)  Please schedule a follow-up appointment as needed.    Prescriptions: ZITHROMAX Z-PAK 250 MG  TABS (AZITHROMYCIN) Take as directed..  #1 pack x 0   Entered and Authorized by:   Cindee Salt MD   Signed by:   Cindee Salt MD on 11/22/2007   Method used:   Print then Give to Patient   RxID:  [id number]  ] Current Allergies (reviewed today): No known allergies  Current Medications (including changes made in today's visit):  FLONASE 50 MCG/ACT SUSP (FLUTICASONE PROPIONATE) Spray 1 spray into both nostrils twice a day BONIVA 150 MG TABS (IBANDRONATE SODIUM) Take 1 tablet by mouth once a month DENAVIR 1 % CREA (PENCICLOVIR) Apply a small amount to affected area every three hours while awake ZYRTEC ALLERGY 10 MG  TABS (CETIRIZINE HCL) as needed BALANCED CARE   TABS (MULTIPLE VITAMINS-MINERALS) Take 1 tablet by mouth once a day ZITHROMAX Z-PAK 250 MG  TABS (AZITHROMYCIN) Take as directed.Marland Kitchen

## 2010-09-24 NOTE — Consult Note (Signed)
Summary: Consultation Report  Consultation Report   Imported By: Eleonore Chiquito 02/23/2007 09:45:37  _____________________________________________________________________  External Attachment:    Type:   Image     Comment:   External Document

## 2010-09-24 NOTE — Assessment & Plan Note (Signed)
Summary: cpx   Vital Signs:  Patient profile:   60 year old female Height:      63 inches Weight:      190.75 pounds BMI:     33.91 Temp:     98 degrees F oral Pulse rate:   76 / minute Pulse rhythm:   regular BP sitting:   124 / 80  (left arm) Cuff size:   regular  Vitals Entered By: Lewanda Rife LPN (October 30, 1608 10:42 AM)  History of Present Illness: here for health mt exam and to rev chronic med problems   is feeling pretty good overall   is having trouble with heartburn - takes acid reducer from [employer] 150 mg of ranitidine  this really works --no more acid in throat  is avoiding spicy food/ night eating and caffiene   knuckle on L hand ring finger is swollen and sore  is not locking just sore   R knee is locking and cannot straighten fully  meniscal tear and surgery -- in past - ? Dr August Saucer  is stiff more than painful - more often than not    some stress - husb had to take a job in [place] -- is planning to move when house sells-- could be a while  glad he got a job   wt is up 8 lb with bmi 33 this has   bp good 124/80 today- no probs with that   osteopenia on boniva and ca and D -- last dexa 6/09 -- will be due for f/u this june   mam 08- is due no change on self exam   pap 08- no gyn problems/ wants to get pap today  colonosc nl in 05   Td 08 - up to date   Allergies (verified): 1)  ! Phenergan 2)  ! Sulfa  Past History:  Past Surgical History: Last updated: 05/25/2008 Tonsillectomy Dexa--08/2005, osteopenia Cesarean section X 2 Right knee- arthroscopy- April 2008 ganglion/ wrist   Family History: Last updated: 07/06/2007 Father with hx of DM, HTN and obesity. Mother with hx of osteoporosis (severe) Paternal side of family + for heart problems and obesity. Cousin died with leukemia. Maternal g'ma with some type of cancer  Overall + hx for CV and DM.  Social History: Last updated: 10/30/2009 Married with two sons in their 20's. Plays golf  for hobby. Walks some, too. non smoker occ alcohol some caffine husband currently works in [place] while trying to sell their house and move   Risk Factors: Smoking Status: never (11/23/2006)  Past Medical History: Asthma allergies osteopenia fever blisters    pod- [facility]--   Social History: Married with two sons in their 20's. Plays golf for hobby. Walks some, too. non smoker occ alcohol some caffine husband currently works in [place] while trying to sell their house and move   Review of Systems General:  Denies fatigue, fever, loss of appetite, and malaise. Eyes:  Denies blurring and eye irritation. ENT:  Denies sore throat. CV:  Denies chest pain or discomfort, palpitations, shortness of breath with exertion, and swelling of feet. Resp:  Denies cough, shortness of breath, sputum productive, and wheezing. GI:  Complains of indigestion; denies abdominal pain, bloody stools, change in bowel habits, dark tarry stools, gas, loss of appetite, nausea, and vomiting. GU:  Denies abnormal vaginal bleeding, discharge, hematuria, and urinary frequency. MS:  Complains of joint pain; some nerve problems in foot are worse- plans to f/u with Dr  Hy[facility]. Derm:  Denies lesion(s), poor wound healing, and rash. Neuro:  Denies headaches, numbness, and tingling. Psych:  Denies anxiety and depression. Endo:  Denies excessive thirst and excessive urination. Heme:  Denies abnormal bruising and bleeding.  Physical Exam  General:  overweight but generally well appearing  Head:  normocephalic, atraumatic, and no abnormalities observed.   Eyes:  vision grossly intact, pupils equal, pupils round, and pupils reactive to light.  no conjunctival pallor, injection or icterus  Ears:  R ear normal and L ear normal.   Nose:  no nasal discharge.   Mouth:  pharynx pink and moist.   Neck:  supple with full rom and no masses or thyromegally, no JVD or carotid bruit  Chest Wall:  No deformities, masses, or  tenderness noted. Breasts:  No mass, nodules, thickening, tenderness, bulging, retraction, inflamation, nipple discharge or skin changes noted.   Lungs:  Normal respiratory effort, chest expands symmetrically. Lungs are clear to auscultation, no crackles or wheezes. Heart:  Normal rate and regular rhythm. S1 and S2 normal without gallop, murmur, click, rub or other extra sounds. Abdomen:  Bowel sounds positive,abdomen soft and non-tender without masses, organomegaly or hernias noted. no renal bruits  Genitalia:  Normal introitus for age, no external lesions, no vaginal discharge, mucosa pink and moist, no vaginal or cervical lesions, no vaginal atrophy, no friaility or hemorrhage, normal uterus size and position, no adnexal masses or tenderness Msk:  poor rom bilat knees L 3rd PIP is slt tender- no swelling or skin change no triggering  no kyphosis  no other acute joint changes  Pulses:  R and L carotid,radial,femoral,dorsalis pedis and posterior tibial pulses are full and equal bilaterally Extremities:  No clubbing, cyanosis, edema, or deformity noted with normal full range of motion of all joints.   Neurologic:  sensation intact to light touch, gait normal, and DTRs symmetrical and normal.   Skin:  Intact without suspicious lesions or rashes Cervical Nodes:  No lymphadenopathy noted Axillary Nodes:  No palpable lymphadenopathy Inguinal Nodes:  No significant adenopathy Psych:  normal affect, talkative and pleasant    Impression & Recommendations:  Problem # 1:  HEALTH MAINTENANCE EXAM (ICD-V70.0) Assessment Comment Only reviewed health habits including diet, exercise and skin cancer prevention reviewed health maintenance list and family history counseled on need for wt loss lab today Orders: Venipuncture (10272) TLB-Lipid Panel (80061-LIPID) TLB-BMP (Basic Metabolic Panel-BMET) (80048-METABOL) TLB-CBC Platelet - w/Differential (85025-CBCD) TLB-Hepatic/Liver Function Pnl  (80076-HEPATIC) TLB-TSH (Thyroid Stimulating Hormone) (84443-TSH) T-Vitamin D (25-Hydroxy) (53664-40347)  Problem # 2:  ROUTINE GYNECOLOGICAL EXAMINATION (ICD-V72.31) Assessment: Comment Only pap and exam done - no complaints   Problem # 3:  OTHER SCREENING MAMMOGRAM (ICD-V76.12) Assessment: Comment Only annual mammogram scheduled adv pt to continue regular self breast exams non remarkable breast exam today  Orders: Radiology Referral (Radiology)  Problem # 4:  OSTEOPENIA (ICD-733.90) Assessment: Unchanged on boniva and ca and D enc to do wt bearing exercise when able  plan 2 year dexa in summer if she has not moved yet Her updated medication list for this problem includes:    Boniva 150 Mg Tabs (Ibandronate sodium) .Marland Kitchen... Take 1 tablet by mouth once a month  Orders: Venipuncture (42595) TLB-Lipid Panel (80061-LIPID) TLB-BMP (Basic Metabolic Panel-BMET) (80048-METABOL) TLB-CBC Platelet - w/Differential (85025-CBCD) TLB-Hepatic/Liver Function Pnl (80076-HEPATIC) TLB-TSH (Thyroid Stimulating Hormone) (84443-TSH) T-Vitamin D (25-Hydroxy) (63875-64332) Radiology Referral (Radiology) Specimen Handling (95188)  Problem # 5:  KNEE PAIN (CZY-606.30) Assessment: Comment Only with stiffness s/p meniscal surgery  years ago  urged to f/u with her ortho for eval   Problem # 6:  FINGER PAIN (ICD-729.5) Assessment: New pain in L middle pip joint - no visible swelling / nl exam ? hx of trauma  disc opt incl x ray/ hand specialist or sport med eval  pt will see how it does with some ice for 1 mo and then call if she wants further eval   Problem # 7:  GERD (ICD-530.81) Assessment: New persistant heartburn and acid in throat - after wt gain disc lifestyle change and given handout from [facility]  will continue ranitidine 150 - and update if any breakthrough symptoms  disc need for wt loss  Her updated medication list for this problem includes:    Ranitidine Hcl 150 Mg Caps (Ranitidine hcl)  .Marland Kitchen... Take one daily by mouth as needed acid reflux  Complete Medication List: 1)  Flonase 50 Mcg/act Susp (Fluticasone propionate) .... Spray 1 spray into both nostrils twice a day 2)  Boniva 150 Mg Tabs (Ibandronate sodium) .... Take 1 tablet by mouth once a month 3)  Denavir 1 % Crea (Penciclovir) .... Apply a small amount to affected area every three hours while awake as needed 4)  Zyrtec Allergy 10 Mg Tabs (Cetirizine hcl) .... As needed 5)  Balanced Care Tabs (Multiple vitamins-minerals) .... Take 1 tablet by mouth once a day 6)  Glucosamine Chondroitin Adv Tabs (Misc natural products) .... Daily 7)  Ranitidine Hcl 150 Mg Caps (Ranitidine hcl) .... Take one daily by mouth as needed acid reflux  Patient Instructions: 1)  follow up with your orthopedic physician for knee pain  2)  continue your ranitidine for reflux - update me if symptoms return  3)  let me know if hand/ knuckle pain does not improve in the next month  4)  we will schedule dexa and mammogram at check out  5)  gyn exam done today 6)  labs today Prescriptions: RANITIDINE HCL 150 MG CAPS (RANITIDINE HCL) take one daily by mouth as needed acid reflux  #90 x 3   Entered and Authorized by:   Judith Part MD   Signed by:   Judith Part MD on 10/30/2009   Method used:   Print then Give to Patient   RxID:   [id number] BONIVA 150 MG TABS (IBANDRONATE SODIUM) Take 1 tablet by mouth once a month  #3 months x 3   Entered and Authorized by:   Judith Part MD   Signed by:   Judith Part MD on 10/30/2009   Method used:   Print then Give to Patient   RxID:   [id number] FLONASE 50 MCG/ACT SUSP (FLUTICASONE PROPIONATE) Spray 1 spray into both nostrils twice a day  #3 mdi x 3   Entered and Authorized by:   Judith Part MD   Signed by:   Judith Part MD on 10/30/2009   Method used:   Print then Give to Patient   RxID:   [id number]   Current Allergies (reviewed today): ! PHENERGAN ! SULFA

## 2010-09-24 NOTE — Progress Notes (Signed)
Summary: 90x3 rx's  Phone Note Refill Request Call back at Home Phone [phone]   Refills Requested: Medication #1:  FLONASE 50 MCG/ACT SUSP Spray 1 spray into both nostrils twice a day  Medication #2:  DENAVIR 1 % CREA Apply a small amount to affected area every three hours while awake 90 x 3 rx (pt has appt with Jarold Motto @ noon hopes to pick up then)  Initial call taken by: Liane Comber,  July 26, 2007 9:35 AM Caller: Patient Call For: tower  Follow-up for Phone Call        Rx left at front desk for patient to pick up. .............................................................Marland KitchenNatasha Shantinique Picazo July 26, 2007 2:10 PM       Prescriptions: DENAVIR 1 % CREA (PENCICLOVIR) Apply a small amount to affected area every three hours while awake  #1 x 3   Entered by:   Kathie Rhodes NP   Authorized by:   Judith Part MD   Signed by:   Kathie Rhodes NP on 07/26/2007   Method used:   Print then Give to Patient   RxID:   [id number] FLONASE 50 MCG/ACT SUSP (FLUTICASONE PROPIONATE) Spray 1 spray into both nostrils twice a day  #1 x 4   Entered by:   Kathie Rhodes NP   Authorized by:   Judith Part MD   Signed by:   Kathie Rhodes NP on 07/26/2007   Method used:   Print then Give to Patient   RxID:   [id number]

## 2010-09-24 NOTE — Progress Notes (Signed)
Summary: wants bone density  Phone Note Call from Patient Call back at cell [phone]   Caller: Patient Call For: dr Lyfe Reihl Summary of Call: pt wants bone density, wants to go to [facility].  would like to go next tues or wed- husbands insurance runs out next friday but he is changing jobs and will have ins later- I told her they may not be able to get her in that soon Initial call taken by: Lowella Petties,  January 28, 2008 4:28 PM  Follow-up for Phone Call        I will do ref and route to [facility] Follow-up by: Judith Part MD,  January 28, 2008 11:17 PM  Additional Follow-up for Phone Call Additional follow up Details #1::        ORDER FAXED TO [facility] FOR APPT 02/02/08. Additional Follow-up by: Carlton Adam,  February 02, 2008 12:04 PM

## 2010-09-24 NOTE — Letter (Signed)
Summary: Results Follow up Letter  [place] [facility]  [address] [place], [place] [address]   Phone: [phone]  Fax: [phone]    11/05/2009 MRN: [id number]    [facility] [place] [address] [place], [place]  [address]    Dear Makayla Garcia,  The following are the results of your recent test(s):  Test         Result    Pap Smear:        Normal __X___  Not Normal _____ Comments: ______________________________________________________ Cholesterol: LDL(Bad cholesterol):         Your goal is less than:         HDL (Good cholesterol):       Your goal is more than: Comments:  ______________________________________________________ Mammogram:        Normal _____  Not Normal _____ Comments:  ___________________________________________________________________ Hemoccult:        Normal _____  Not normal _______ Comments:    _____________________________________________________________________ Other Tests:    We routinely do not discuss normal results over the telephone.  If you desire a copy of the results, or you have any questions about this information we can discuss them at your next office visit.   Sincerely,    Marne A. Milinda Antis, M.D.  MAT:lsf

## 2010-09-24 NOTE — Letter (Signed)
Summary: Results Follow up Letter  [place] [facility]  [address] [place], [place] [address]   Phone: [phone]  Fax: [phone]    11/08/2009 MRN: [id number]    [facility] [place] [address] [place], [place]  [address]    Dear Ms. VANTOL,  The following are the results of your recent test(s):  Test         Result    Pap Smear:        Normal _____  Not Normal _____ Comments: ______________________________________________________ Cholesterol: LDL(Bad cholesterol):         Your goal is less than:         HDL (Good cholesterol):       Your goal is more than: Comments:  ______________________________________________________ Mammogram:        Normal __X___  Not Normal _____ Comments:Please repeat in one year.  ___________________________________________________________________ Hemoccult:        Normal _____  Not normal _______ Comments:    _____________________________________________________________________ Other Tests:    We routinely do not discuss normal results over the telephone.  If you desire a copy of the results, or you have any questions about this information we can discuss them at your next office visit.   Sincerely,    Roxy Manns, MD  MT/ri

## 2014-09-11 NOTE — Progress Notes (Signed)
Please put orders in Epic surgery 09-25-14 pre op 09-19-14 Thanks

## 2014-09-12 ENCOUNTER — Ambulatory Visit: Payer: Self-pay | Admitting: Orthopedic Surgery

## 2014-09-12 NOTE — Progress Notes (Signed)
Preoperative surgical orders have been place into the Epic hospital system for Essance Grall on 09/12/2014, 11:16 AM  by Mickel Crow for surgery on 09-25-2014.  Preop Total Knee orders including Experal, IV Tylenol, and IV Decadron as long as there are no contraindications to the above medications. Arlee Muslim, PA-C

## 2014-09-15 NOTE — Patient Instructions (Addendum)
Makayla Garcia  09/15/2014   Your procedure is scheduled on: 09/25/2014    Report to [facility] Main  Entrance and follow signs to               Makayla Garcia at     1045 AM.  Call this number if you have problems the morning of surgery [phone]   Remember:  Do not eat food after midnite.  May have clear liquids until 0700am then nothing by mouth. .     Take these medicines the morning of surgery with A SIP OF WATER: zyrtec, Flonase, Zantac                                You may not have any metal on your body including hair pins and              piercings  Do not wear jewelry, make-up, lotions, powders or perfumes.             Do not wear nail polish.  Do not shave  48 hours prior to surgery.                 Do not bring valuables to the hospital. [place].  Contacts, dentures or bridgework may not be worn into surgery.  Leave suitcase in the car. After surgery it may be brought to your room.         Special Instructions: coughing and deep breathing exercises, leg exercises               Please read over the following fact sheets you were given: _____________________________________________________________________             [facility] - Preparing for Surgery Before surgery, you can play an important role.  Because skin is not sterile, your skin needs to be as free of germs as possible.  You can reduce the number of germs on your skin by washing with CHG (chlorahexidine gluconate) soap before surgery.  CHG is an antiseptic cleaner which kills germs and bonds with the skin to continue killing germs even after washing. Please DO NOT use if you have an allergy to CHG or antibacterial soaps.  If your skin becomes reddened/irritated stop using the CHG and inform your nurse when you arrive at Short Stay. Do not shave (including legs and underarms) for at least 48 hours prior to the first CHG shower.  You  may shave your face/neck. Please follow these instructions carefully:  1.  Shower with CHG Soap the night before surgery and the  morning of Surgery.  2.  If you choose to wash your hair, wash your hair first as usual with your  normal  shampoo.  3.  After you shampoo, rinse your hair and body thoroughly to remove the  shampoo.                           4.  Use CHG as you would any other liquid soap.  You can apply chg directly  to the skin and wash                       Gently  with a scrungie or clean washcloth.  5.  Apply the CHG Soap to your body ONLY FROM THE NECK DOWN.   Do not use on face/ open                           Wound or open sores. Avoid contact with eyes, ears mouth and genitals (private parts).                       Wash face,  Genitals (private parts) with your normal soap.             6.  Wash thoroughly, paying special attention to the area where your surgery  will be performed.  7.  Thoroughly rinse your body with warm water from the neck down.  8.  DO NOT shower/wash with your normal soap after using and rinsing off  the CHG Soap.                9.  Pat yourself dry with a clean towel.            10.  Wear clean pajamas.            11.  Place clean sheets on your bed the night of your first shower and do not  sleep with pets. Day of Surgery : Do not apply any lotions/deodorants the morning of surgery.  Please wear clean clothes to the hospital/surgery center.  FAILURE TO FOLLOW THESE INSTRUCTIONS MAY RESULT IN THE CANCELLATION OF YOUR SURGERY PATIENT SIGNATURE_________________________________  NURSE SIGNATURE__________________________________  ________________________________________________________________________    CLEAR LIQUID DIET   Foods Allowed                                                                     Foods Excluded  Coffee and tea, regular and decaf                             liquids that you cannot  Plain Jell-O in any flavor                                              see through such as: Fruit ices (not with fruit pulp)                                     milk, soups, orange juice  Iced Popsicles                                    All solid food Carbonated beverages, regular and diet                                    Cranberry, grape and apple juices Sports drinks like Gatorade Lightly seasoned clear broth or consume(fat free) Sugar, honey syrup  Sample  Menu Breakfast                                Lunch                                     Supper Cranberry juice                    Beef broth                            Chicken broth Jell-O                                     Grape juice                           Apple juice Coffee or tea                        Jell-O                                      Popsicle                                                Coffee or tea                        Coffee or tea  _____________________________________________________________________   WHAT IS A BLOOD TRANSFUSION? Blood Transfusion Information  A transfusion is the replacement of blood or some of its parts. Blood is made up of multiple cells which provide different functions.  Red blood cells carry oxygen and are used for blood loss replacement.  White blood cells fight against infection.  Platelets control bleeding.  Plasma helps clot blood.  Other blood products are available for specialized needs, such as hemophilia or other clotting disorders. BEFORE THE TRANSFUSION  Who gives blood for transfusions?   Healthy volunteers who are fully evaluated to make sure their blood is safe. This is blood bank blood. Transfusion therapy is the safest it has ever been in the practice of medicine. Before blood is taken from a donor, a complete history is taken to make sure that person has no history of diseases nor engages in risky social behavior (examples are intravenous drug use or sexual activity with multiple partners). The  donor's travel history is screened to minimize risk of transmitting infections, such as malaria. The donated blood is tested for signs of infectious diseases, such as HIV and hepatitis. The blood is then tested to be sure it is compatible with you in order to minimize the chance of a transfusion reaction. If you or a relative donates blood, this is often done in anticipation of surgery and is not appropriate for emergency situations. It takes many days to process the donated blood. RISKS AND COMPLICATIONS Although transfusion therapy is very safe and saves many lives, the main dangers of transfusion include:  1.  Getting an infectious disease. 2. Developing a transfusion reaction. This is an allergic reaction to something in the blood you were given. Every precaution is taken to prevent this. The decision to have a blood transfusion has been considered carefully by your caregiver before blood is given. Blood is not given unless the benefits outweigh the risks. AFTER THE TRANSFUSION  Right after receiving a blood transfusion, you will usually feel much better and more energetic. This is especially true if your red blood cells have gotten low (anemic). The transfusion raises the level of the red blood cells which carry oxygen, and this usually causes an energy increase.  The nurse administering the transfusion will monitor you carefully for complications. HOME CARE INSTRUCTIONS  No special instructions are needed after a transfusion. You may find your energy is better. Speak with your caregiver about any limitations on activity for underlying diseases you may have. SEEK MEDICAL CARE IF:   Your condition is not improving after your transfusion.  You develop redness or irritation at the intravenous (IV) site. SEEK IMMEDIATE MEDICAL CARE IF:  Any of the following symptoms occur over the next 12 hours:  Shaking chills.  You have a temperature by mouth above 102 F (38.9 C), not controlled by  medicine.  Chest, back, or muscle pain.  People around you feel you are not acting correctly or are confused.  Shortness of breath or difficulty breathing.  Dizziness and fainting.  You get a rash or develop hives.  You have a decrease in urine output.  Your urine turns a dark color or changes to pink, red, or brown. Any of the following symptoms occur over the next 10 days:  You have a temperature by mouth above 102 F (38.9 C), not controlled by medicine.  Shortness of breath.  Weakness after normal activity.  The white part of the eye turns yellow (jaundice).  You have a decrease in the amount of urine or are urinating less often.  Your urine turns a dark color or changes to pink, red, or brown. Document Released: 08/08/2000 Document Revised: 11/03/2011 Document Reviewed: 03/27/2008 Exit[employer] Patient Information 2014 [place].  _______________________________________________________________________  Incentive Spirometer  An incentive spirometer is a tool that can help keep your lungs clear and active. This tool measures how well you are filling your lungs with each breath. Taking long deep breaths may help reverse or decrease the chance of developing breathing (pulmonary) problems (especially infection) following:  A long period of time when you are unable to move or be active. BEFORE THE PROCEDURE   If the spirometer includes an indicator to show your best effort, your nurse or respiratory therapist will set it to a desired goal.  If possible, sit up straight or lean slightly forward. Try not to slouch.  Hold the incentive spirometer in an upright position. INSTRUCTIONS FOR USE  3. Sit on the edge of your bed if possible, or sit up as far as you can in bed or on a chair. 4. Hold the incentive spirometer in an upright position. 5. Breathe out normally. 6. Place the mouthpiece in your mouth and seal your lips tightly around it. 7. Breathe in slowly and as  deeply as possible, raising the piston or the ball toward the top of the column. 8. Hold your breath for 3-5 seconds or for as long as possible. Allow the piston or ball to fall to the bottom of the column. 9. Remove the mouthpiece from your mouth and breathe out normally.  10. Rest for a few seconds and repeat Steps 1 through 7 at least 10 times every 1-2 hours when you are awake. Take your time and take a few normal breaths between deep breaths. 11. The spirometer may include an indicator to show your best effort. Use the indicator as a goal to work toward during each repetition. 12. After each set of 10 deep breaths, practice coughing to be sure your lungs are clear. If you have an incision (the cut made at the time of surgery), support your incision when coughing by placing a pillow or rolled up towels firmly against it. Once you are able to get out of bed, walk around indoors and cough well. You may stop using the incentive spirometer when instructed by your caregiver.  RISKS AND COMPLICATIONS  Take your time so you do not get dizzy or light-headed.  If you are in pain, you may need to take or ask for pain medication before doing incentive spirometry. It is harder to take a deep breath if you are having pain. AFTER USE  Rest and breathe slowly and easily.  It can be helpful to keep track of a log of your progress. Your caregiver can provide you with a simple table to help with this. If you are using the spirometer at home, follow these instructions: [place] IF:   You are having difficultly using the spirometer.  You have trouble using the spirometer as often as instructed.  Your pain medication is not giving enough relief while using the spirometer.  You develop fever of 100.5 F (38.1 C) or higher. SEEK IMMEDIATE MEDICAL CARE IF:   You cough up bloody sputum that had not been present before.  You develop fever of 102 F (38.9 C) or greater.  You develop worsening  pain at or near the incision site. MAKE SURE YOU:   Understand these instructions.  Will watch your condition.  Will get help right away if you are not doing well or get worse. Document Released: 12/22/2006 Document Revised: 11/03/2011 Document Reviewed: 02/22/2007 [facility] Patient Information 2014 [place], [place].   ________________________________________________________________________

## 2014-09-19 ENCOUNTER — Encounter (HOSPITAL_COMMUNITY): Payer: Self-pay

## 2014-09-19 ENCOUNTER — Encounter (HOSPITAL_COMMUNITY)
Admission: RE | Admit: 2014-09-19 | Discharge: 2014-09-19 | Disposition: A | Discharge status: Home / Self Care | Payer: 59 | Source: Ambulatory Visit | Attending: Orthopedic Surgery | Admitting: Orthopedic Surgery

## 2014-09-19 DIAGNOSIS — M179 Osteoarthritis of knee, unspecified: Secondary | ICD-10-CM | POA: Diagnosis not present

## 2014-09-19 DIAGNOSIS — Z01818 Encounter for other preprocedural examination: Secondary | ICD-10-CM | POA: Diagnosis present

## 2014-09-19 HISTORY — DX: Unspecified osteoarthritis, unspecified site: M19.90

## 2014-09-19 HISTORY — DX: Headache: R51

## 2014-09-19 HISTORY — DX: Headache, unspecified: R51.9

## 2014-09-19 HISTORY — DX: Peripheral vascular disease, unspecified: I73.9

## 2014-09-19 HISTORY — DX: Unspecified asthma, uncomplicated: J45.909

## 2014-09-19 LAB — PROTIME-INR
INR: 0.95 (ref 0.00–1.49)
Prothrombin Time: 12.8 seconds (ref 11.6–15.2)

## 2014-09-19 LAB — COMPREHENSIVE METABOLIC PANEL
ALT: 52 U/L — ABNORMAL HIGH (ref 0–35)
AST: 36 U/L (ref 0–37)
Albumin: 4.3 g/dL (ref 3.5–5.2)
Alkaline Phosphatase: 123 U/L — ABNORMAL HIGH (ref 39–117)
Anion gap: 7 (ref 5–15)
BUN: 20 mg/dL (ref 6–23)
CO2: 28 mmol/L (ref 19–32)
Calcium: 9.4 mg/dL (ref 8.4–10.5)
Chloride: 108 mmol/L (ref 96–112)
Creatinine, Ser: 0.8 mg/dL (ref 0.50–1.10)
GFR calc Af Amer: 89 mL/min — ABNORMAL LOW (ref >90)
GFR calc non Af Amer: 77 mL/min — ABNORMAL LOW (ref >90)
Glucose, Bld: 131 mg/dL — ABNORMAL HIGH (ref 70–99)
Potassium: 4.5 mmol/L (ref 3.5–5.1)
Sodium: 143 mmol/L (ref 135–145)
Total Bilirubin: 0.4 mg/dL (ref 0.3–1.2)
Total Protein: 6.7 g/dL (ref 6.0–8.3)

## 2014-09-19 LAB — URINALYSIS, ROUTINE W REFLEX MICROSCOPIC
Bilirubin Urine: NEGATIVE
Glucose, UA: NEGATIVE mg/dL
Hgb urine dipstick: NEGATIVE
Ketones, ur: NEGATIVE mg/dL
Leukocytes, UA: NEGATIVE
Nitrite: NEGATIVE
Protein, ur: NEGATIVE mg/dL
Specific Gravity, Urine: 1.024 (ref 1.005–1.030)
Urobilinogen, UA: 0.2 mg/dL (ref 0.0–1.0)
pH: 5 (ref 5.0–8.0)

## 2014-09-19 LAB — CBC
HCT: 41.7 % (ref 36.0–46.0)
Hemoglobin: 13.4 g/dL (ref 12.0–15.0)
MCH: 28.9 pg (ref 26.0–34.0)
MCHC: 32.1 g/dL (ref 30.0–36.0)
MCV: 89.9 fL (ref 78.0–100.0)
Platelets: 195 10*3/uL (ref 150–400)
RBC: 4.64 MIL/uL (ref 3.87–5.11)
RDW: 13.4 % (ref 11.5–15.5)
WBC: 5.4 10*3/uL (ref 4.0–10.5)

## 2014-09-19 LAB — APTT: aPTT: 37 seconds (ref 24–37)

## 2014-09-19 NOTE — Progress Notes (Signed)
EKG- 05/30/2014 on chart  Clearance 05/30/2014- Dr Carol Ada on chart with LOV on chart

## 2014-09-20 NOTE — H&P (Signed)
TOTAL KNEE ADMISSION H&P  Patient is being admitted for right total knee arthroplasty.  Subjective:  Chief Complaint:right knee pain.  HPI: Makayla Garcia, 64 y.o. female, has a history of pain and functional disability in the right knee due to arthritis and has failed non-surgical conservative treatments for greater than 12 weeks to includeNSAID's and/or analgesics, corticosteriod injections, viscosupplementation injections and activity modification.  Onset of symptoms was gradual, starting 8 years ago with gradually worsening course since that time. The patient noted prior procedures on the knee to include  arthroscopy and menisectomy on the right knee(s).  Patient currently rates pain in the right knee(s) at 7 out of 10 with activity. Patient has night pain, worsening of pain with activity and weight bearing, pain that interferes with activities of daily living, pain with passive range of motion, crepitus and joint swelling.  Patient has evidence of periarticular osteophytes and joint space narrowing by imaging studies. There is no active infection.  Patient Active Problem List   Diagnosis Date Noted  . GERD 10/30/2009  . KNEE PAIN 10/30/2009  . FINGER PAIN 10/30/2009  . PLANTAR FASCIITIS 05/25/2008  . OSTEOPENIA 12/01/2007  . FEVER BLISTER 11/23/2006  . ALLERGIC RHINITIS 11/23/2006  . ASTHMA 11/23/2006  . FIBROCYSTIC BREAST DISEASE 11/23/2006   Past Medical History  Diagnosis Date  . Peripheral vascular disease     vein crushed in left leg several years ago not currently a problem   . Asthma   . Headache     hx of migraines  . Arthritis     Past Surgical History  Procedure Laterality Date  . Tonsillectomy    . Right hand surgery       to remove cyst   . Cesearean section       x 2   . Wisdom tooth extraction    . Right knee arthroscopy        Current outpatient prescriptions:  .  alendronate (FOSAMAX) 70 MG tablet, Take 70 mg by mouth once a week. Take with a full glass of  water on an empty stomach., Disp: , Rfl:  .  BIOTIN 5000 PO, Take 1 tablet by mouth daily., Disp: , Rfl:  .  CALCIUM-VITAMIN D PO, Take 1 tablet by mouth daily., Disp: , Rfl:  .  cetirizine (ZYRTEC) 10 MG tablet, Take 10 mg by mouth daily., Disp: , Rfl:  .  Cholecalciferol (VITAMIN D3) 2000 UNITS capsule, Take 2,000 Units by mouth daily., Disp: , Rfl:  .  fluticasone (FLONASE) 50 MCG/ACT nasal spray, Place 1 spray into both nostrils 2 (two) times daily., Disp: , Rfl:  .  glucosamine-chondroitin 500-400 MG tablet, Take 2 tablets by mouth daily., Disp: , Rfl:  .  LUTEIN-ZEAXANTHIN PO, Take 1 tablet by mouth daily., Disp: , Rfl:  .  Multiple Vitamin (MULTIVITAMIN WITH MINERALS) TABS tablet, Take 1 tablet by mouth daily., Disp: , Rfl:  .  ranitidine (ZANTAC) 150 MG tablet, Take 150 mg by mouth 2 (two) times daily. Once daily but sometimes takes twice daily, Disp: , Rfl:   Allergies  Allergen Reactions  . Percocet [Oxycodone-Acetaminophen]     Severe stomach pain  . Promethazine Hcl     REACTION: severe stomach pain  . Sulfonamide Derivatives     REACTION: rash    History  Substance Use Topics  . Smoking status: Never Smoker   . Smokeless tobacco: Never Used  . Alcohol Use: Yes     Comment: rare  Review of Systems  Constitutional: Positive for malaise/fatigue. Negative for fever, chills, weight loss and diaphoresis.  HENT: Negative.   Eyes: Negative.   Respiratory: Positive for shortness of breath. Negative for cough, hemoptysis, sputum production and wheezing.        SOB on exertion  Cardiovascular: Negative.   Gastrointestinal: Positive for heartburn. Negative for nausea, vomiting, abdominal pain, diarrhea, constipation, blood in stool and melena.  Genitourinary: Negative.   Musculoskeletal: Positive for joint pain. Negative for myalgias, back pain, falls and neck pain.       Bilateral knee pain  Skin: Negative.   Neurological: Negative for weakness.  Endo/Heme/Allergies:  Positive for environmental allergies. Negative for polydipsia. Does not bruise/bleed easily.  Psychiatric/Behavioral: Negative.     Objective:  Physical Exam  Constitutional: She is oriented to person, place, and time. She appears well-developed. No distress.  Overweight  HENT:  Head: Normocephalic and atraumatic.  Right Ear: External ear normal.  Left Ear: External ear normal.  Nose: Nose normal.  Mouth/Throat: Oropharynx is clear and moist.  Eyes: Conjunctivae and EOM are normal.  Neck: Normal range of motion. Neck supple.  Cardiovascular: Normal rate, regular rhythm, normal heart sounds and intact distal pulses.   No murmur heard. Respiratory: Effort normal and breath sounds normal. No respiratory distress. She has no wheezes.  GI: Soft. Bowel sounds are normal. She exhibits no distension. There is no tenderness.  Musculoskeletal:       Right hip: Normal.       Left hip: Normal.       Right knee: She exhibits decreased range of motion and swelling. She exhibits no effusion and no erythema. Tenderness found. Medial joint line and lateral joint line tenderness noted.       Left knee: She exhibits decreased range of motion and swelling. She exhibits no effusion and no erythema. Tenderness found. Medial joint line and lateral joint line tenderness noted.       Right lower leg: She exhibits no tenderness and no swelling.       Left lower leg: She exhibits no tenderness and no swelling.  Neurological: She is alert and oriented to person, place, and time. She has normal strength and normal reflexes. No sensory deficit.  Skin: No rash noted. She is not diaphoretic. No erythema.  Psychiatric: She has a normal mood and affect. Her behavior is normal.    Vital signs in last 24 hours: Temp:  [98.4 F (36.9 C)] 98.4 F (36.9 C) (01/26 1347) Pulse Rate:  [90] 90 (01/26 1347) Resp:  [16] 16 (01/26 1347) BP: (126)/(72) 126/72 mmHg (01/26 1347) SpO2:  [97 %] 97 % (01/26 1347) Weight:   [84.823 kg (187 lb)] 84.823 kg (187 lb) (01/26 1347)    Imaging Review Plain radiographs demonstrate severe degenerative joint disease of the right knee(s). The overall alignment ismild varus. The bone quality appears to be good for age and reported activity level.  Assessment/Plan:  End stage arthritis, right knee   The patient history, physical examination, clinical judgment of the provider and imaging studies are consistent with end stage degenerative joint disease of the right knee(s) and total knee arthroplasty is deemed medically necessary. The treatment options including medical management, injection therapy arthroscopy and arthroplasty were discussed at length. The risks and benefits of total knee arthroplasty were presented and reviewed. The risks due to aseptic loosening, infection, stiffness, patella tracking problems, thromboembolic complications and other imponderables were discussed. The patient acknowledged the explanation, agreed to proceed with  the plan and consent was signed. Patient is being admitted for inpatient treatment for surgery, pain control, PT, OT, prophylactic antibiotics, VTE prophylaxis, progressive ambulation and ADL's and discharge planning. The patient is planning to be discharged home with home health services    TXA IV Patient needs left knee intra-articular cortisone injection in OR PCP: Dr. Carol Ada   Ardeen Jourdain, PA-C

## 2014-09-21 LAB — MRSA CULTURE

## 2014-09-25 ENCOUNTER — Inpatient Hospital Stay (HOSPITAL_COMMUNITY): Payer: 59 | Admitting: Anesthesiology

## 2014-09-25 ENCOUNTER — Encounter (HOSPITAL_COMMUNITY)
Admission: RE | Disposition: A | Discharge status: Home Health | Payer: Self-pay | Source: Ambulatory Visit | Attending: Orthopedic Surgery

## 2014-09-25 ENCOUNTER — Encounter (HOSPITAL_COMMUNITY): Payer: Self-pay | Admitting: *Deleted

## 2014-09-25 ENCOUNTER — Inpatient Hospital Stay (HOSPITAL_COMMUNITY)
Admission: RE | Admit: 2014-09-25 | Discharge: 2014-09-27 | DRG: 470 | Disposition: A | Discharge status: Home Health | Payer: 59 | Source: Ambulatory Visit | Attending: Orthopedic Surgery | Admitting: Orthopedic Surgery

## 2014-09-25 DIAGNOSIS — M25561 Pain in right knee: Secondary | ICD-10-CM | POA: Diagnosis present

## 2014-09-25 DIAGNOSIS — E663 Overweight: Secondary | ICD-10-CM | POA: Diagnosis present

## 2014-09-25 DIAGNOSIS — K219 Gastro-esophageal reflux disease without esophagitis: Secondary | ICD-10-CM | POA: Diagnosis present

## 2014-09-25 DIAGNOSIS — Z6833 Body mass index (BMI) 33.0-33.9, adult: Secondary | ICD-10-CM

## 2014-09-25 DIAGNOSIS — M1710 Unilateral primary osteoarthritis, unspecified knee: Secondary | ICD-10-CM | POA: Diagnosis present

## 2014-09-25 DIAGNOSIS — M1712 Unilateral primary osteoarthritis, left knee: Secondary | ICD-10-CM | POA: Diagnosis present

## 2014-09-25 DIAGNOSIS — M170 Bilateral primary osteoarthritis of knee: Secondary | ICD-10-CM | POA: Diagnosis present

## 2014-09-25 DIAGNOSIS — Z79899 Other long term (current) drug therapy: Secondary | ICD-10-CM

## 2014-09-25 DIAGNOSIS — M1711 Unilateral primary osteoarthritis, right knee: Secondary | ICD-10-CM

## 2014-09-25 DIAGNOSIS — M179 Osteoarthritis of knee, unspecified: Secondary | ICD-10-CM | POA: Diagnosis present

## 2014-09-25 DIAGNOSIS — Z01812 Encounter for preprocedural laboratory examination: Secondary | ICD-10-CM | POA: Diagnosis not present

## 2014-09-25 HISTORY — PX: TOTAL KNEE ARTHROPLASTY: SHX125

## 2014-09-25 LAB — TYPE AND SCREEN
ABO/RH(D): O NEG
Antibody Screen: NEGATIVE

## 2014-09-25 LAB — ABO/RH: ABO/RH(D): O NEG

## 2014-09-25 SURGERY — ARTHROPLASTY, KNEE, TOTAL
Anesthesia: Spinal | Site: Knee | Laterality: Right

## 2014-09-25 MED ORDER — CEFAZOLIN SODIUM-DEXTROSE 2-3 GM-% IV SOLR
2.0000 g | Freq: Four times a day (QID) | INTRAVENOUS | Status: AC
  Administered 2014-09-25 – 2014-09-26 (×2): 2 g via INTRAVENOUS
  Filled 2014-09-25 (×2): qty 50

## 2014-09-25 MED ORDER — PROPOFOL 10 MG/ML IV BOLUS
INTRAVENOUS | Status: AC
  Filled 2014-09-25: qty 20

## 2014-09-25 MED ORDER — METHYLPREDNISOLONE ACETATE 40 MG/ML IJ SUSP
INTRAMUSCULAR | Status: AC
  Filled 2014-09-25: qty 2

## 2014-09-25 MED ORDER — SODIUM CHLORIDE 0.9 % IV SOLN
INTRAVENOUS | Status: DC
  Administered 2014-09-25: 75 mL/h via INTRAVENOUS

## 2014-09-25 MED ORDER — DEXAMETHASONE SODIUM PHOSPHATE 10 MG/ML IJ SOLN
INTRAMUSCULAR | Status: AC
  Filled 2014-09-25: qty 1

## 2014-09-25 MED ORDER — ACETAMINOPHEN 650 MG RE SUPP: 650.0000 mg | Freq: Four times a day (QID) | RECTAL | Status: DC | PRN

## 2014-09-25 MED ORDER — CHLORHEXIDINE GLUCONATE 4 % EX LIQD: 60.0000 mL | Freq: Once | CUTANEOUS | Status: DC

## 2014-09-25 MED ORDER — ONDANSETRON HCL 4 MG/2ML IJ SOLN
INTRAMUSCULAR | Status: DC | PRN
  Administered 2014-09-25: 4 mg via INTRAVENOUS

## 2014-09-25 MED ORDER — DOCUSATE SODIUM 100 MG PO CAPS
100.0000 mg | ORAL_CAPSULE | Freq: Two times a day (BID) | ORAL | Status: DC
  Administered 2014-09-25 – 2014-09-27 (×4): 100 mg via ORAL

## 2014-09-25 MED ORDER — CEFAZOLIN SODIUM-DEXTROSE 2-3 GM-% IV SOLR
2.0000 g | INTRAVENOUS | Status: AC
  Administered 2014-09-25: 2 g via INTRAVENOUS

## 2014-09-25 MED ORDER — FLEET ENEMA 7-19 GM/118ML RE ENEM: 1.0000 | ENEMA | Freq: Once | RECTAL | Status: AC | PRN

## 2014-09-25 MED ORDER — ACETAMINOPHEN 500 MG PO TABS
1000.0000 mg | ORAL_TABLET | Freq: Four times a day (QID) | ORAL | Status: AC
  Administered 2014-09-25 – 2014-09-26 (×4): 1000 mg via ORAL
  Filled 2014-09-25 (×5): qty 2

## 2014-09-25 MED ORDER — BUPIVACAINE LIPOSOME 1.3 % IJ SUSP
20.0000 mL | Freq: Once | INTRAMUSCULAR | Status: DC
  Filled 2014-09-25: qty 20

## 2014-09-25 MED ORDER — LIDOCAINE HCL 1 % IJ SOLN
INTRAMUSCULAR | Status: AC
  Filled 2014-09-25: qty 20

## 2014-09-25 MED ORDER — METOCLOPRAMIDE HCL 5 MG/ML IJ SOLN: 5.0000 mg | Freq: Three times a day (TID) | INTRAMUSCULAR | Status: DC | PRN

## 2014-09-25 MED ORDER — SODIUM CHLORIDE 0.9 % IV SOLN: INTRAVENOUS | Status: DC

## 2014-09-25 MED ORDER — TRANEXAMIC ACID 100 MG/ML IV SOLN
1000.0000 mg | INTRAVENOUS | Status: AC
  Administered 2014-09-25: 1000 mg via INTRAVENOUS
  Filled 2014-09-25: qty 10

## 2014-09-25 MED ORDER — BUPIVACAINE HCL (PF) 0.25 % IJ SOLN
INTRAMUSCULAR | Status: AC
  Filled 2014-09-25: qty 30

## 2014-09-25 MED ORDER — PHENOL 1.4 % MT LIQD: 1.0000 | OROMUCOSAL | Status: DC | PRN

## 2014-09-25 MED ORDER — SODIUM CHLORIDE 0.9 % IJ SOLN
INTRAMUSCULAR | Status: DC | PRN
  Administered 2014-09-25: 30 mL

## 2014-09-25 MED ORDER — DEXAMETHASONE SODIUM PHOSPHATE 10 MG/ML IJ SOLN
10.0000 mg | Freq: Once | INTRAMUSCULAR | Status: AC
  Administered 2014-09-25: 10 mg via INTRAVENOUS

## 2014-09-25 MED ORDER — 0.9 % SODIUM CHLORIDE (POUR BTL) OPTIME
TOPICAL | Status: DC | PRN
  Administered 2014-09-25: 1000 mL

## 2014-09-25 MED ORDER — BUPIVACAINE HCL 0.25 % IJ SOLN
INTRAMUSCULAR | Status: DC | PRN
  Administered 2014-09-25: 20 mL

## 2014-09-25 MED ORDER — DEXTROSE 5 % IV SOLN
500.0000 mg | Freq: Four times a day (QID) | INTRAVENOUS | Status: DC | PRN
  Administered 2014-09-25: 500 mg via INTRAVENOUS
  Filled 2014-09-25 (×2): qty 5

## 2014-09-25 MED ORDER — LACTATED RINGERS IV SOLN
INTRAVENOUS | Status: DC
  Administered 2014-09-25 (×2): via INTRAVENOUS
  Administered 2014-09-25: 1000 mL via INTRAVENOUS

## 2014-09-25 MED ORDER — MORPHINE SULFATE 2 MG/ML IJ SOLN: 1.0000 mg | INTRAMUSCULAR | Status: DC | PRN

## 2014-09-25 MED ORDER — ACETAMINOPHEN 325 MG PO TABS
650.0000 mg | ORAL_TABLET | Freq: Four times a day (QID) | ORAL | Status: DC | PRN
  Administered 2014-09-26 – 2014-09-27 (×3): 650 mg via ORAL
  Filled 2014-09-25 (×2): qty 2

## 2014-09-25 MED ORDER — FAMOTIDINE 20 MG PO TABS
20.0000 mg | ORAL_TABLET | Freq: Every day | ORAL | Status: DC
  Administered 2014-09-26 – 2014-09-27 (×2): 20 mg via ORAL
  Filled 2014-09-25 (×2): qty 1

## 2014-09-25 MED ORDER — HYDROMORPHONE HCL 2 MG PO TABS
2.0000 mg | ORAL_TABLET | ORAL | Status: DC | PRN
  Administered 2014-09-25 – 2014-09-27 (×6): 2 mg via ORAL
  Filled 2014-09-25 (×7): qty 1

## 2014-09-25 MED ORDER — DIPHENHYDRAMINE HCL 12.5 MG/5ML PO ELIX: 12.5000 mg | ORAL_SOLUTION | ORAL | Status: DC | PRN

## 2014-09-25 MED ORDER — LACTATED RINGERS IV SOLN: INTRAVENOUS | Status: DC

## 2014-09-25 MED ORDER — METOCLOPRAMIDE HCL 10 MG PO TABS: 5.0000 mg | ORAL_TABLET | Freq: Three times a day (TID) | ORAL | Status: DC | PRN

## 2014-09-25 MED ORDER — STERILE WATER FOR IRRIGATION IR SOLN
Status: DC | PRN
  Administered 2014-09-25: 1500 mL

## 2014-09-25 MED ORDER — MIDAZOLAM HCL 5 MG/5ML IJ SOLN
INTRAMUSCULAR | Status: DC | PRN
  Administered 2014-09-25: 2 mg via INTRAVENOUS

## 2014-09-25 MED ORDER — PROPOFOL INFUSION 10 MG/ML OPTIME
INTRAVENOUS | Status: DC | PRN
  Administered 2014-09-25: 100 ug/kg/min via INTRAVENOUS

## 2014-09-25 MED ORDER — MIDAZOLAM HCL 2 MG/2ML IJ SOLN
INTRAMUSCULAR | Status: AC
  Filled 2014-09-25: qty 2

## 2014-09-25 MED ORDER — METHOCARBAMOL 500 MG PO TABS
500.0000 mg | ORAL_TABLET | Freq: Four times a day (QID) | ORAL | Status: DC | PRN
  Administered 2014-09-25 – 2014-09-27 (×6): 500 mg via ORAL
  Filled 2014-09-25 (×6): qty 1

## 2014-09-25 MED ORDER — KETOROLAC TROMETHAMINE 15 MG/ML IJ SOLN
7.5000 mg | Freq: Four times a day (QID) | INTRAMUSCULAR | Status: AC | PRN
  Administered 2014-09-25: 7.5 mg via INTRAVENOUS
  Filled 2014-09-25: qty 1

## 2014-09-25 MED ORDER — MEPERIDINE HCL 50 MG/ML IJ SOLN: 6.2500 mg | INTRAMUSCULAR | Status: DC | PRN

## 2014-09-25 MED ORDER — FENTANYL CITRATE 0.05 MG/ML IJ SOLN
INTRAMUSCULAR | Status: DC | PRN
  Administered 2014-09-25: 100 ug via INTRAVENOUS

## 2014-09-25 MED ORDER — HYDROMORPHONE HCL 1 MG/ML IJ SOLN: 0.2500 mg | INTRAMUSCULAR | Status: DC | PRN

## 2014-09-25 MED ORDER — PHENYLEPHRINE HCL 10 MG/ML IJ SOLN
INTRAMUSCULAR | Status: DC | PRN
  Administered 2014-09-25 (×5): 80 ug via INTRAVENOUS

## 2014-09-25 MED ORDER — CEFAZOLIN SODIUM-DEXTROSE 2-3 GM-% IV SOLR
INTRAVENOUS | Status: AC
  Filled 2014-09-25: qty 50

## 2014-09-25 MED ORDER — ONDANSETRON HCL 4 MG/2ML IJ SOLN
4.0000 mg | Freq: Four times a day (QID) | INTRAMUSCULAR | Status: DC | PRN
  Administered 2014-09-26: 4 mg via INTRAVENOUS
  Filled 2014-09-25: qty 2

## 2014-09-25 MED ORDER — BUPIVACAINE IN DEXTROSE 0.75-8.25 % IT SOLN
INTRATHECAL | Status: DC | PRN
  Administered 2014-09-25: 1.4 mL via INTRATHECAL

## 2014-09-25 MED ORDER — MENTHOL 3 MG MT LOZG
1.0000 | LOZENGE | OROMUCOSAL | Status: DC | PRN
  Filled 2014-09-25: qty 9

## 2014-09-25 MED ORDER — RIVAROXABAN 10 MG PO TABS
10.0000 mg | ORAL_TABLET | Freq: Every day | ORAL | Status: DC
  Administered 2014-09-26 – 2014-09-27 (×2): 10 mg via ORAL
  Filled 2014-09-25 (×3): qty 1

## 2014-09-25 MED ORDER — SODIUM CHLORIDE 0.9 % IJ SOLN
INTRAMUSCULAR | Status: AC
  Filled 2014-09-25: qty 50

## 2014-09-25 MED ORDER — TRAMADOL HCL 50 MG PO TABS: 50.0000 mg | ORAL_TABLET | Freq: Four times a day (QID) | ORAL | Status: DC | PRN

## 2014-09-25 MED ORDER — FENTANYL CITRATE 0.05 MG/ML IJ SOLN
INTRAMUSCULAR | Status: AC
  Filled 2014-09-25: qty 2

## 2014-09-25 MED ORDER — PHENYLEPHRINE 40 MCG/ML (10ML) SYRINGE FOR IV PUSH (FOR BLOOD PRESSURE SUPPORT)
PREFILLED_SYRINGE | INTRAVENOUS | Status: AC
  Filled 2014-09-25: qty 10

## 2014-09-25 MED ORDER — ONDANSETRON HCL 4 MG PO TABS: 4.0000 mg | ORAL_TABLET | Freq: Four times a day (QID) | ORAL | Status: DC | PRN

## 2014-09-25 MED ORDER — FLUTICASONE PROPIONATE 50 MCG/ACT NA SUSP
1.0000 | Freq: Two times a day (BID) | NASAL | Status: DC
  Administered 2014-09-25 – 2014-09-27 (×4): 1 via NASAL
  Filled 2014-09-25: qty 16

## 2014-09-25 MED ORDER — BISACODYL 10 MG RE SUPP: 10.0000 mg | Freq: Every day | RECTAL | Status: DC | PRN

## 2014-09-25 MED ORDER — ACETAMINOPHEN 10 MG/ML IV SOLN
1000.0000 mg | Freq: Once | INTRAVENOUS | Status: AC
  Administered 2014-09-25: 1000 mg via INTRAVENOUS
  Filled 2014-09-25: qty 100

## 2014-09-25 MED ORDER — METHYLPREDNISOLONE ACETATE 40 MG/ML IJ SUSP
2.0000 mg | Freq: Once | INTRAMUSCULAR | Status: AC
  Administered 2014-09-25: 2 mg via INTRA_ARTICULAR

## 2014-09-25 MED ORDER — POLYETHYLENE GLYCOL 3350 17 G PO PACK
17.0000 g | PACK | Freq: Every day | ORAL | Status: DC | PRN
Start: 2014-09-25 — End: 2014-09-27

## 2014-09-25 MED ORDER — ONDANSETRON HCL 4 MG/2ML IJ SOLN
INTRAMUSCULAR | Status: AC
  Filled 2014-09-25: qty 2

## 2014-09-25 MED ORDER — BUPIVACAINE LIPOSOME 1.3 % IJ SUSP
INTRAMUSCULAR | Status: DC | PRN
  Administered 2014-09-25: 20 mL

## 2014-09-25 MED ORDER — DEXAMETHASONE SODIUM PHOSPHATE 10 MG/ML IJ SOLN
10.0000 mg | Freq: Once | INTRAMUSCULAR | Status: AC
  Administered 2014-09-26: 10 mg via INTRAVENOUS
  Filled 2014-09-25: qty 1

## 2014-09-25 SURGICAL SUPPLY — 60 items
BAG ZIPLOCK 12X15 (MISCELLANEOUS) ×2 IMPLANT
BANDAGE ELASTIC 6 VELCRO ST LF (GAUZE/BANDAGES/DRESSINGS) ×2 IMPLANT
BANDAGE ESMARK 6X9 LF (GAUZE/BANDAGES/DRESSINGS) ×1 IMPLANT
BLADE SAG 18X100X1.27 (BLADE) ×2 IMPLANT
BLADE SAW SGTL 11.0X1.19X90.0M (BLADE) ×2 IMPLANT
BNDG CONFORM 6X.82 1P STRL (GAUZE/BANDAGES/DRESSINGS) ×2 IMPLANT
BNDG ESMARK 6X9 LF (GAUZE/BANDAGES/DRESSINGS) ×2
BOWL SMART MIX CTS (DISPOSABLE) ×2 IMPLANT
CAPT KNEE TOTAL 3 ATTUNE ×2 IMPLANT
CEMENT HV SMART SET (Cement) ×4 IMPLANT
CUFF TOURN SGL QUICK 34 (TOURNIQUET CUFF) ×1
CUFF TRNQT CYL 34X4X40X1 (TOURNIQUET CUFF) ×1 IMPLANT
DECANTER SPIKE VIAL GLASS SM (MISCELLANEOUS) ×2 IMPLANT
DRAPE EXTREMITY T 121X128X90 (DRAPE) ×2 IMPLANT
DRAPE POUCH INSTRU U-SHP 10X18 (DRAPES) ×2 IMPLANT
DRAPE U-SHAPE 47X51 STRL (DRAPES) ×2 IMPLANT
DRSG ADAPTIC 3X8 NADH LF (GAUZE/BANDAGES/DRESSINGS) ×2 IMPLANT
DRSG PAD ABDOMINAL 8X10 ST (GAUZE/BANDAGES/DRESSINGS) ×2 IMPLANT
DURAPREP 26ML APPLICATOR (WOUND CARE) ×2 IMPLANT
ELECT REM PT RETURN 9FT ADLT (ELECTROSURGICAL) ×2
ELECTRODE REM PT RTRN 9FT ADLT (ELECTROSURGICAL) ×1 IMPLANT
EVACUATOR 1/8 PVC DRAIN (DRAIN) ×2 IMPLANT
FACESHIELD WRAPAROUND (MASK) ×10 IMPLANT
GAUZE SPONGE 4X4 12PLY STRL (GAUZE/BANDAGES/DRESSINGS) ×2 IMPLANT
GLOVE BIO SURGEON STRL SZ7.5 (GLOVE) IMPLANT
GLOVE BIO SURGEON STRL SZ8 (GLOVE) ×2 IMPLANT
GLOVE BIOGEL PI IND STRL 6.5 (GLOVE) IMPLANT
GLOVE BIOGEL PI IND STRL 8 (GLOVE) ×1 IMPLANT
GLOVE BIOGEL PI INDICATOR 6.5 (GLOVE)
GLOVE BIOGEL PI INDICATOR 8 (GLOVE) ×1
GLOVE SURG SS PI 6.5 STRL IVOR (GLOVE) IMPLANT
GOWN STRL REUS W/TWL LRG LVL3 (GOWN DISPOSABLE) ×2 IMPLANT
GOWN STRL REUS W/TWL XL LVL3 (GOWN DISPOSABLE) IMPLANT
HANDPIECE INTERPULSE COAX TIP (DISPOSABLE) ×1
IMMOBILIZER KNEE 20 (SOFTGOODS) ×4 IMPLANT
IMMOBILIZER KNEE 20 THIGH 36 (SOFTGOODS) ×1 IMPLANT
KIT BASIN OR (CUSTOM PROCEDURE TRAY) ×2 IMPLANT
MANIFOLD NEPTUNE II (INSTRUMENTS) ×2 IMPLANT
NDL SAFETY ECLIPSE 18X1.5 (NEEDLE) ×2 IMPLANT
NEEDLE HYPO 18GX1.5 SHARP (NEEDLE) ×2
NS IRRIG 1000ML POUR BTL (IV SOLUTION) ×2 IMPLANT
PACK TOTAL JOINT (CUSTOM PROCEDURE TRAY) ×2 IMPLANT
PAD ABD 8X10 STRL (GAUZE/BANDAGES/DRESSINGS) ×2 IMPLANT
PADDING CAST COTTON 6X4 STRL (CAST SUPPLIES) ×6 IMPLANT
POSITIONER SURGICAL ARM (MISCELLANEOUS) ×2 IMPLANT
SET HNDPC FAN SPRY TIP SCT (DISPOSABLE) ×1 IMPLANT
STRIP CLOSURE SKIN 1/2X4 (GAUZE/BANDAGES/DRESSINGS) ×4 IMPLANT
SUCTION FRAZIER 12FR DISP (SUCTIONS) ×2 IMPLANT
SUT MNCRL AB 4-0 PS2 18 (SUTURE) ×2 IMPLANT
SUT VIC AB 2-0 CT1 27 (SUTURE) ×3
SUT VIC AB 2-0 CT1 TAPERPNT 27 (SUTURE) ×3 IMPLANT
SUT VLOC 180 0 24IN GS25 (SUTURE) ×2 IMPLANT
SYR 20CC LL (SYRINGE) ×2 IMPLANT
SYR 50ML LL SCALE MARK (SYRINGE) ×2 IMPLANT
TAPE STRIPS DRAPE STRL (GAUZE/BANDAGES/DRESSINGS) ×2 IMPLANT
TOWEL OR 17X26 10 PK STRL BLUE (TOWEL DISPOSABLE) ×2 IMPLANT
TOWEL OR NON WOVEN STRL DISP B (DISPOSABLE) IMPLANT
TRAY FOLEY CATH 14FRSI W/METER (CATHETERS) ×2 IMPLANT
WATER STERILE IRR 1500ML POUR (IV SOLUTION) ×2 IMPLANT
WRAP KNEE MAXI GEL POST OP (GAUZE/BANDAGES/DRESSINGS) ×2 IMPLANT

## 2014-09-25 NOTE — Anesthesia Preprocedure Evaluation (Signed)
Anesthesia Evaluation  Patient identified by MRN, date of birth, ID band Patient awake    Reviewed: Allergy & Precautions, NPO status , Patient's Chart, lab work & pertinent test results  Airway Mallampati: II  TM Distance: >3 FB Neck ROM: Full    Dental no notable dental hx.    Pulmonary neg pulmonary ROS, asthma ,  breath sounds clear to auscultation  Pulmonary exam normal       Cardiovascular negative cardio ROS  Rhythm:Regular Rate:Normal     Neuro/Psych negative neurological ROS  negative psych ROS   GI/Hepatic negative GI ROS, Neg liver ROS,   Endo/Other  negative endocrine ROS  Renal/GU negative Renal ROS  negative genitourinary   Musculoskeletal negative musculoskeletal ROS (+)   Abdominal   Peds negative pediatric ROS (+)  Hematology negative hematology ROS (+)   Anesthesia Other Findings   Reproductive/Obstetrics negative OB ROS                             Anesthesia Physical Anesthesia Plan  ASA: II  Anesthesia Plan: Spinal   Post-op Pain Management:    Induction:   Airway Management Planned: Simple Face Mask  Additional Equipment:   Intra-op Plan:   Post-operative Plan: Extubation in OR  Informed Consent: I have reviewed the patients History and Physical, chart, labs and discussed the procedure including the risks, benefits and alternatives for the proposed anesthesia with the patient or authorized representative who has indicated his/her understanding and acceptance.   Dental advisory given  Plan Discussed with: CRNA  Anesthesia Plan Comments:         Anesthesia Quick Evaluation

## 2014-09-25 NOTE — Anesthesia Postprocedure Evaluation (Signed)
  Anesthesia Post-op Note  Patient: Makayla Garcia  Procedure(s) Performed: Procedure(s): RIGHT TOTAL KNEE ARTHROPLASTY LEFT KNEE INJECTION (Right)  Patient Location: PACU  Anesthesia Type:Spinal  Level of Consciousness: awake and alert   Airway and Oxygen Therapy: Patient Spontanous Breathing  Post-op Pain: none  Post-op Assessment: Post-op Vital signs reviewed  Post-op Vital Signs: stable  Last Vitals:  Filed Vitals:   09/25/14 1530  BP: 122/64  Pulse: 68  Temp: 36.3 C  Resp: 16    Complications: No apparent anesthesia complications

## 2014-09-25 NOTE — Op Note (Signed)
Pre-operative diagnosis- Osteoarthritis  Bilateral knee(s)  Post-operative diagnosis- Osteoarthritis Bilateral knee(s)  Procedure-  Right  Total Knee Arthroplasty; Left knee cortisone injection  Surgeon- Dione Plover. Zan Orlick, MD  Assistant- Arlee Muslim, PA-C   Anesthesia-  Spinal  EBL-* No blood loss amount entered *   Drains Hemovac  Tourniquet time-  Total Tourniquet Time Documented: Thigh (Right) - 30 minutes Total: Thigh (Right) - 30 minutes     Complications- None  Condition-PACU - hemodynamically stable.   Brief Clinical Note  Makayla Garcia is a 64 y.o. year old female with end stage OA of her right knee with progressively worsening pain and dysfunction. She has constant pain, with activity and at rest and significant functional deficits with difficulties even with ADLs. She has had extensive non-op management including analgesics, injections of cortisone and viscosupplements, and home exercise program, but remains in significant pain with significant dysfunction.Radiographs show bone on bone arthritis medial and patellofemoral. She presents now for right Total Knee Arthroplasty.    Procedure in detail---   The patient is brought into the operating room and positioned supine on the operating table. After successful administration of  Spinal,   a tourniquet is placed high on the  Right thigh(s) and the lower extremity is prepped and draped in the usual sterile fashion. Time out is performed by the operating team and then the  Right lower extremity is wrapped in Esmarch, knee flexed and the tourniquet inflated to 300 mmHg. Prior to inflating the tourniquet on the right leg, I prepped the left knee with Betadine and injected the knee with 80 mg (2 ml) of Depomedrol without problems..      A midline incision is made over the right knee with a ten blade through the subcutaneous tissue to the level of the extensor mechanism. A fresh blade is used to make a medial parapatellar arthrotomy.  Soft tissue over the proximal medial tibia is subperiosteally elevated to the joint line with a knife and into the semimembranosus bursa with a Cobb elevator. Soft tissue over the proximal lateral tibia is elevated with attention being paid to avoiding the patellar tendon on the tibial tubercle. The patella is everted, knee flexed 90 degrees and the ACL and PCL are removed. Findings are bone on bone medial and patellofemoral with large medial osteophytes.        The drill is used to create a starting hole in the distal femur and the canal is thoroughly irrigated with sterile saline to remove the fatty contents. The 5 degree Right  valgus alignment guide is placed into the femoral canal and the distal femoral cutting block is pinned to remove 10 mm off the distal femur. Resection is made with an oscillating saw.      The tibia is subluxed forward and the menisci are removed. The extramedullary alignment guide is placed referencing proximally at the medial aspect of the tibial tubercle and distally along the second metatarsal axis and tibial crest. The block is pinned to remove 78mm off the more deficient medial  side. Resection is made with an oscillating saw. Size 5is the most appropriate size for the tibia and the proximal tibia is prepared with the modular drill and keel punch for that size.      The femoral sizing guide is placed and size 6 is most appropriate. Rotation is marked off the epicondylar axis and confirmed by creating a rectangular flexion gap at 90 degrees. The size 6 cutting block is pinned in this rotation  and the anterior, posterior and chamfer cuts are made with the oscillating saw. The intercondylar block is then placed and that cut is made.      Trial size 5 tibial component, trial size 6 posterior stabilized femur and a 8  mm posterior stabilized rotating platform insert trial is placed. Full extension is achieved with excellent varus/valgus and anterior/posterior balance throughout full  range of motion. The patella is everted and thickness measured to be 22  mm. Free hand resection is taken to 12 mm, a 38 template is placed, lug holes are drilled, trial patella is placed, and it tracks normally. Osteophytes are removed off the posterior femur with the trial in place. All trials are removed and the cut bone surfaces prepared with pulsatile lavage. Cement is mixed and once ready for implantation, the size 5 tibial implant, size  6 narrow posterior stabilized femoral component, and the size 38 patella are cemented in place and the patella is held with the clamp. The trial insert is placed and the knee held in full extension. The Exparel (20 ml mixed with 30 ml saline) and .25% Bupivicaine, are injected into the extensor mechanism, posterior capsule, medial and lateral gutters and subcutaneous tissues.  All extruded cement is removed and once the cement is hard the permanent 8 mm posterior stabilized rotating platform insert is placed into the tibial tray.      The wound is copiously irrigated with saline solution and the extensor mechanism closed over a hemovac drain with #1 V-loc suture. The tourniquet is released for a total tourniquet time of 30  minutes. Flexion against gravity is 140 degrees and the patella tracks normally. Subcutaneous tissue is closed with 2.0 vicryl and subcuticular with running 4.0 Monocryl. The incision is cleaned and dried and steri-strips and a bulky sterile dressing are applied. The limb is placed into a knee immobilizer and the patient is awakened and transported to recovery in stable condition.      Please note that a surgical assistant was a medical necessity for this procedure in order to perform it in a safe and expeditious manner. Surgical assistant was necessary to retract the ligaments and vital neurovascular structures to prevent injury to them and also necessary for proper positioning of the limb to allow for anatomic placement of the prosthesis.   Dione Plover  Mayfield Schoene, MD    09/25/2014, 2:18 PM

## 2014-09-25 NOTE — Anesthesia Procedure Notes (Signed)
Spinal Patient location during procedure: OR Staffing Anesthesiologist: CARIGNAN, PETER Performed by: anesthesiologist  Preanesthetic Checklist Completed: patient identified, site marked, surgical consent, pre-op evaluation, timeout performed, IV checked, risks and benefits discussed and monitors and equipment checked Spinal Block Patient position: sitting Prep: Betadine Patient monitoring: heart rate, continuous pulse ox and blood pressure Approach: right paramedian Location: L3-4 Injection technique: single-shot Needle Needle type: Spinocan  Needle gauge: 22 G Needle length: 9 cm Additional Notes Expiration date of kit checked and confirmed. Patient tolerated procedure well, without complications.     

## 2014-09-25 NOTE — Interval H&P Note (Signed)
History and Physical Interval Note:  09/25/2014 12:23 PM  Makayla Garcia  has presented today for surgery, with the diagnosis of OA OF RIGHT KNEE  The various methods of treatment have been discussed with the patient and family. After consideration of risks, benefits and other options for treatment, the patient has consented to  Procedure(s): RIGHT TOTAL KNEE ARTHROPLASTY (Right) as a surgical intervention .  The patient's history has been reviewed, patient examined, no change in status, stable for surgery.  I have reviewed the patient's chart and labs.  Questions were answered to the patient's satisfaction.     Gearlean Alf

## 2014-09-25 NOTE — Transfer of Care (Signed)
Immediate Anesthesia Transfer of Care Note  Patient: Makayla Garcia  Procedure(s) Performed: Procedure(s): RIGHT TOTAL KNEE ARTHROPLASTY LEFT KNEE INJECTION (Right)  Patient Location: PACU  Anesthesia Type:Spinal  Level of Consciousness: awake, alert , oriented and patient cooperative  Airway & Oxygen Therapy: Patient Spontanous Breathing and Patient connected to face mask oxygen  Post-op Assessment: Report given to RN and Post -op Vital signs reviewed and stable  Post vital signs: Reviewed and stable  Last Vitals:  Filed Vitals:   09/25/14 1028  BP: 130/81  Pulse: 82  Temp: 37.1 C  Resp: 20    Complications: No apparent anesthesia complications

## 2014-09-26 ENCOUNTER — Encounter (HOSPITAL_COMMUNITY): Payer: Self-pay | Admitting: Orthopedic Surgery

## 2014-09-26 LAB — CBC
HCT: 35.7 % — ABNORMAL LOW (ref 36.0–46.0)
Hemoglobin: 11.6 g/dL — ABNORMAL LOW (ref 12.0–15.0)
MCH: 29.1 pg (ref 26.0–34.0)
MCHC: 32.5 g/dL (ref 30.0–36.0)
MCV: 89.5 fL (ref 78.0–100.0)
Platelets: 215 10*3/uL (ref 150–400)
RBC: 3.99 MIL/uL (ref 3.87–5.11)
RDW: 13.1 % (ref 11.5–15.5)
WBC: 13.8 10*3/uL — ABNORMAL HIGH (ref 4.0–10.5)

## 2014-09-26 LAB — BASIC METABOLIC PANEL
Anion gap: 9 (ref 5–15)
BUN: 9 mg/dL (ref 6–23)
CO2: 24 mmol/L (ref 19–32)
Calcium: 8.4 mg/dL (ref 8.4–10.5)
Chloride: 107 mmol/L (ref 96–112)
Creatinine, Ser: 0.6 mg/dL (ref 0.50–1.10)
GFR calc Af Amer: >90 mL/min (ref >90)
GFR calc non Af Amer: >90 mL/min (ref >90)
Glucose, Bld: 157 mg/dL — ABNORMAL HIGH (ref 70–99)
Potassium: 3.8 mmol/L (ref 3.5–5.1)
Sodium: 140 mmol/L (ref 135–145)

## 2014-09-26 MED ORDER — TRAMADOL HCL 50 MG PO TABS: 50.0000 mg | ORAL_TABLET | Freq: Four times a day (QID) | ORAL | Status: DC | PRN

## 2014-09-26 MED ORDER — METHOCARBAMOL 500 MG PO TABS: 500.0000 mg | ORAL_TABLET | Freq: Four times a day (QID) | ORAL | Status: DC | PRN

## 2014-09-26 MED ORDER — RIVAROXABAN 10 MG PO TABS: 10.0000 mg | ORAL_TABLET | Freq: Every day | ORAL | Status: DC

## 2014-09-26 MED ORDER — HYDROMORPHONE HCL 2 MG PO TABS: 2.0000 mg | ORAL_TABLET | ORAL | Status: DC | PRN

## 2014-09-26 NOTE — Discharge Instructions (Addendum)
° °Dr. Frank Aluisio °Total Joint Specialist °[facility] °[address] °[place], [place] [address] °[phone] ° °TOTAL KNEE REPLACEMENT POSTOPERATIVE DIRECTIONS ° ° ° °Knee Rehabilitation, Guidelines Following Surgery  °Results after knee surgery are often greatly improved when you follow the exercise, range of motion and muscle strengthening exercises prescribed by your doctor. Safety measures are also important to protect the knee from further injury. Any time any of these exercises cause you to have increased pain or swelling in your knee joint, decrease the amount until you are comfortable again and slowly increase them. If you have problems or questions, call your caregiver or physical therapist for advice.  ° °HOME CARE INSTRUCTIONS  °Remove items at home which could result in a fall. This includes throw rugs or furniture in walking pathways.  °Continue medications as instructed at time of discharge. °You may have some home medications which will be placed on hold until you complete the course of blood thinner medication.  °You may start showering once you are discharged home but do not submerge the incision under water. Just pat the incision dry and apply a dry gauze dressing on daily. °Walk with walker as instructed.  °You may resume a sexual relationship in one month or when given the OK by  your doctor.  °· Use walker as long as suggested by your caregivers. °· Avoid periods of inactivity such as sitting longer than an hour when not asleep. This helps prevent blood clots.  °You may put full weight on your legs and walk as much as is comfortable.  °You may return to work once you are cleared by your doctor.  °Do not drive a car for 6 weeks or until released by you surgeon.  °· Do not drive while taking narcotics.  °Wear the elastic stockings for three weeks following surgery during the day but you may remove then at night. °Make sure you keep all of your appointments after your  operation with all of your doctors and caregivers. You should call the office at the above phone number and make an appointment for approximately two weeks after the date of your surgery. °Change the dressing daily and reapply a dry dressing each time. °Please pick up a stool softener and laxative for home use as long as you are requiring pain medications. °· ICE to the affected knee every three hours for 30 minutes at a time and then as needed for pain and swelling.  Continue to use ice on the knee for pain and swelling from surgery. You may notice swelling that will progress down to the foot and ankle.  This is normal after surgery.  Elevate the leg when you are not up walking on it.   °It is important for you to complete the blood thinner medication as prescribed by your doctor. °· Continue to use the breathing machine which will help keep your temperature down.  It is common for your temperature to cycle up and down following surgery, especially at night when you are not up moving around and exerting yourself.  The breathing machine keeps your lungs expanded and your temperature down. ° °RANGE OF MOTION AND STRENGTHENING EXERCISES  °Rehabilitation of the knee is important following a knee injury or an operation. After just a few days of immobilization, the muscles of the thigh which control the knee become weakened and shrink (atrophy). Knee exercises are designed to build up the tone and strength of the thigh muscles and to improve knee   motion. Often times heat used for twenty to thirty minutes before working out will loosen up your tissues and help with improving the range of motion but do not use heat for the first two weeks following surgery. These exercises can be done on a training (exercise) mat, on the floor, on a table or on a bed. Use what ever works the best and is most comfortable for you Knee exercises include:  °Leg Lifts - While your knee is still immobilized in a splint or cast, you can do  straight leg raises. Lift the leg to 60 degrees, hold for 3 sec, and slowly lower the leg. Repeat 10-20 times 2-3 times daily. Perform this exercise against resistance later as your knee gets better.  °Quad and Hamstring Sets - Tighten up the muscle on the front of the thigh (Quad) and hold for 5-10 sec. Repeat this 10-20 times hourly. Hamstring sets are done by pushing the foot backward against an object and holding for 5-10 sec. Repeat as with quad sets.  °A rehabilitation program following serious knee injuries can speed recovery and prevent re-injury in the future due to weakened muscles. Contact your doctor or a physical therapist for more information on knee rehabilitation.  ° °SKILLED REHAB INSTRUCTIONS: °If the patient is transferred to a skilled rehab facility following release from the hospital, a list of the current medications will be sent to the facility for the patient to continue.  When discharged from the skilled rehab facility, please have the facility set up the patient's Home Health Physical Therapy prior to being released. Also, the skilled facility will be responsible for providing the patient with their medications at time of release from the facility to include their pain medication, the muscle relaxants, and their blood thinner medication. If the patient is still at the rehab facility at time of the two week follow up appointment, the skilled rehab facility will also need to assist the patient in arranging follow up appointment in our office and any transportation needs. ° °MAKE SURE YOU:  °Understand these instructions.  °Will watch your condition.  °Will get help right away if you are not doing well or get worse.  ° ° °Pick up stool softner and laxative for home use following surgery while on pain medications. °Do not submerge incision under water. °Please use good hand washing techniques while changing dressing each day. °May shower starting three days after surgery. °Please use a clean  towel to pat the incision dry following showers. °Continue to use ice for pain and swelling after surgery. °Do not use any lotions or creams on the incision until instructed by your surgeon. ° °Take Xarelto for two and a half more weeks, then discontinue Xarelto. °Once the patient has completed the blood thinner regimen, then take a Baby 81 mg Aspirin daily for three more weeks. ° °Postoperative Constipation Protocol ° °Constipation - defined medically as fewer than three stools per week and severe constipation as less than one stool per week. ° °One of the most common issues patients have following surgery is constipation.  Even if you have a regular bowel pattern at home, your normal regimen is likely to be disrupted due to multiple reasons following surgery.  Combination of anesthesia, postoperative narcotics, change in appetite and fluid intake all can affect your bowels.  In order to avoid complications following surgery, here are some recommendations in order to help you during your recovery period. ° °Colace (docusate) - Pick up an over-the-counter form of   Colace or another stool softener and take twice a day as long as you are requiring postoperative pain medications.  Take with a full glass of water daily.  If you experience loose stools or diarrhea, hold the colace until you stool forms back up.  If your symptoms do not get better within 1 week or if they get worse, check with your doctor.  Dulcolax (bisacodyl) - Pick up over-the-counter and take as directed by the product packaging as needed to assist with the movement of your bowels.  Take with a full glass of water.  Use this product as needed if not relieved by Colace only.   MiraLax (polyethylene glycol) - Pick up over-the-counter to have on hand.  MiraLax is a solution that will increase the amount of water in your bowels to assist with bowel movements.  Take as directed and can mix with a glass of water, juice, soda, coffee, or tea.  Take if you  go more than two days without a movement. Do not use MiraLax more than once per day. Call your doctor if you are still constipated or irregular after using this medication for 7 days in a row.  If you continue to have problems with postoperative constipation, please contact the office for further assistance and recommendations.  If you experience "the worst abdominal pain ever" or develop nausea or vomiting, please contact the office immediatly for further recommendations for treatment.   Information on my medicine - XARELTO (Rivaroxaban)  This medication education was reviewed with me or my healthcare representative as part of my discharge preparation.  The pharmacist that spoke with me during my hospital stay was:  Minda Ditto, [facility]  Why was Xarelto prescribed for you? Xarelto was prescribed for you to reduce the risk of blood clots forming after orthopedic surgery. The medical term for these abnormal blood clots is venous thromboembolism (VTE).  What do you need to know about xarelto ? Take your Xarelto ONCE DAILY at the same time every day. You may take it either with or without food.  If you have difficulty swallowing the tablet whole, you may crush it and mix in applesauce just prior to taking your dose.  Take Xarelto exactly as prescribed by your doctor and DO NOT stop taking Xarelto without talking to the doctor who prescribed the medication.  Stopping without other VTE prevention medication to take the place of Xarelto may increase your risk of developing a clot.  After discharge, you should have regular check-up appointments with your healthcare provider that is prescribing your Xarelto.    What do you do if you miss a dose? If you miss a dose, take it as soon as you remember on the same day then continue your regularly scheduled once daily regimen the next day. Do not take two doses of Xarelto on the same day.   Important Safety Information A possible side effect of  Xarelto is bleeding. You should call your healthcare provider right away if you experience any of the following: ? Bleeding from an injury or your nose that does not stop. ? Unusual colored urine (red or dark brown) or unusual colored stools (red or black). ? Unusual bruising for unknown reasons. ? A serious fall or if you hit your head (even if there is no bleeding).  Some medicines may interact with Xarelto and might increase your risk of bleeding while on Xarelto. To help avoid this, consult your healthcare provider or pharmacist prior to using any new  prescription or non-prescription medications, including herbals, vitamins, non-steroidal anti-inflammatory drugs (NSAIDs) and supplements.  This website has more information on Xarelto: [email or web address]

## 2014-09-26 NOTE — Evaluation (Signed)
Occupational Therapy Evaluation Patient Details Name: Makayla Garcia MRN: [id number] DOB: Oct 06, 1950 Today's Date: 09/26/2014    History of Present Illness Pt is s/p RIGHT TOTAL KNEE ARTHROPLASTY LEFT KNEE INJECTION    Clinical Impression   Pt doing well up to the 3in1 with min assist. Did become nauseous at the end of the session. BP in chair 134/71. Nursing made aware and came to room. Will follow to progress ADL independence for return home with husband.    Follow Up Recommendations  No OT follow up;Supervision/Assistance - 24 hour    Equipment Recommendations  None recommended by OT    Recommendations for Other Services       Precautions / Restrictions Precautions Precautions: Knee Required Braces or Orthoses: Knee Immobilizer - Right Knee Immobilizer - Right: Discontinue once straight leg raise with < 10 degree lag Restrictions Weight Bearing Restrictions: No      Mobility Bed Mobility               General bed mobility comments: in chair  Transfers Overall transfer level: Needs assistance Equipment used: Rolling walker (2 wheeled) Transfers: Sit to/from Stand Sit to Stand: Min assist         General transfer comment: verbal cues for hand placement and LE management.    Balance                                            ADL Overall ADL's : Needs assistance/impaired Eating/Feeding: Independent;Sitting   Grooming: Wash/dry hands;Set up;Sitting   Upper Body Bathing: Set up;Sitting   Lower Body Bathing: Moderate assistance;Sit to/from stand   Upper Body Dressing : Set up;Sitting   Lower Body Dressing: Moderate assistance;Sit to/from stand   Toilet Transfer: Minimal assistance;Ambulation;BSC;RW   Toileting- Clothing Manipulation and Hygiene: Minimal assistance;Sit to/from stand         General ADL Comments: Pt states she purchased AE kit and would like to practice with AE. Pt did well transferring into bathroom to 3in1 with min  cues for walker sequence. SHe will have to side step through bathroom doorway so practiced some side steps over to commode. Pt did become nauseous at the end of session as she was getting ready to sit in the chair.      Vision                     Perception     Praxis      Pertinent Vitals/Pain Pain Assessment: 0-10 Pain Score: 4  Pain Location: R knee Pain Descriptors / Indicators: Aching Pain Intervention(s): Repositioned;Ice applied     Hand Dominance     Extremity/Trunk Assessment Upper Extremity Assessment Upper Extremity Assessment: Overall WFL for tasks assessed           Communication Communication Communication: No difficulties   Cognition Arousal/Alertness: Awake/alert Behavior During Therapy: WFL for tasks assessed/performed Overall Cognitive Status: Within Functional Limits for tasks assessed                     General Comments       Exercises       Shoulder Instructions      Home Living Family/patient expects to be discharged to:: Private residence Living Arrangements: Spouse/significant other Available Help at Discharge: Family;Available 24 hours/day Type of Home: House Home Access: Elevator     Home Layout: One  level     Bathroom Shower/Tub: Occupational psychologist: Standard     Home Equipment: Shower seat - built in;Walker - 2 wheels;Bedside commode;Adaptive equipment Adaptive Equipment: Reacher;Sock aid;Long-handled shoe horn;Long-handled sponge        Prior Functioning/Environment Level of Independence: Independent             OT Diagnosis: Generalized weakness   OT Problem List: Decreased strength;Decreased knowledge of use of DME or AE   OT Treatment/Interventions: Self-care/ADL training;Patient/family education;Therapeutic activities;DME and/or AE instruction    OT Goals(Current goals can be found in the care plan section) Acute Rehab OT Goals Patient Stated Goal: become more  independent. OT Goal Formulation: With patient Time For Goal Achievement: 10/03/14 Potential to Achieve Goals: Good  OT Frequency: Min 2X/week   Barriers to D/C:            Co-evaluation              End of Session Equipment Utilized During Treatment: Rolling walker;Right knee immobilizer;Gait belt CPM Right Knee CPM Right Knee: Off  Activity Tolerance: Other (comment) (nausea at end of session) Patient left: in chair;with call bell/phone within reach   Time: 0900-0930 OT Time Calculation (min): 30 min Charges:  OT General Charges $OT Visit: 1 Procedure OT Evaluation $Initial OT Evaluation Tier I: 1 Procedure OT Treatments $Therapeutic Activity: 8-22 mins G-Codes:    Jules Schick  [phone] 09/26/2014, 9:40 AM

## 2014-09-26 NOTE — Evaluation (Signed)
Physical Therapy Evaluation Patient Details Name: Makayla Garcia MRN: [id number] DOB: 06/24/1951 Today's Date: 09/26/2014   History of Present Illness  Pt is s/p RIGHT TOTAL KNEE ARTHROPLASTY LEFT KNEE INJECTION   Clinical Impression  On eval, pt required Min assist for mobility-able to ambulate ~100 feet with RW.     Follow Up Recommendations Home health PT    Equipment Recommendations  None recommended by PT    Recommendations for Other Services       Precautions / Restrictions Precautions Precautions: Knee Required Braces or Orthoses: Knee Immobilizer - Right Knee Immobilizer - Right: Discontinue once straight leg raise with < 10 degree lag Restrictions Weight Bearing Restrictions: No RLE Weight Bearing: Weight bearing as tolerated      Mobility  Bed Mobility               General bed mobility comments: in chair  Transfers Overall transfer level: Needs assistance Equipment used: Rolling walker (2 wheeled) Transfers: Sit to/from Stand Sit to Stand: Min assist         General transfer comment: verbal cues for hand placement and LE management.  Ambulation/Gait Ambulation/Gait assistance: Min guard Ambulation Distance (Feet): 100 Feet Assistive device: Rolling walker (2 wheeled) Gait Pattern/deviations: Step-to pattern;Antalgic;Decreased stride length     General Gait Details: VCs safety, seqeunce.   Stairs            Wheelchair Mobility    Modified Rankin (Stroke Patients Only)       Balance                                             Pertinent Vitals/Pain Pain Assessment: 0-10 Pain Score: 4  Pain Location: R knee Pain Descriptors / Indicators: Aching Pain Intervention(s): Monitored during session;Ice applied    Home Living                        Prior Function                 Hand Dominance        Extremity/Trunk Assessment   Upper Extremity Assessment: Defer to OT evaluation            Lower Extremity Assessment: RLE deficits/detail RLE Deficits / Details: hip flex 3-/5, moves ankle well    Cervical / Trunk Assessment: Normal  Communication      Cognition Arousal/Alertness: Awake/alert Behavior During Therapy: WFL for tasks assessed/performed Overall Cognitive Status: Within Functional Limits for tasks assessed                      General Comments      Exercises Total Joint Exercises Ankle Circles/Pumps: AROM;Both;10 reps;Supine Quad Sets: AROM;Both;10 reps;Supine Heel Slides: AAROM;Right;10 reps;Supine Hip ABduction/ADduction: AAROM;Right;10 reps;Supine Straight Leg Raises: AAROM;Right;10 reps;Supine Goniometric ROM: 10-50 degrees      Assessment/Plan    PT Assessment Patient needs continued PT services  PT Diagnosis Difficulty walking;Acute pain   PT Problem List Decreased strength;Decreased range of motion;Decreased activity tolerance;Decreased balance;Decreased knowledge of use of DME;Pain  PT Treatment Interventions DME instruction;Gait training;Functional mobility training;Therapeutic activities;Therapeutic exercise;Patient/family education   PT Goals (Current goals can be found in the Care Plan section) Acute Rehab PT Goals Patient Stated Goal: become more independent. PT Goal Formulation: With patient Time For Goal Achievement: 10/03/14 Potential to Achieve Goals: Good  Frequency 7X/week   Barriers to discharge        Co-evaluation               End of Session Equipment Utilized During Treatment: Gait belt;Right knee immobilizer Activity Tolerance: Patient tolerated treatment well Patient left: in chair;with call bell/phone within reach           Time: 0956-1019 PT Time Calculation (min) (ACUTE ONLY): 23 min   Charges:   PT Evaluation $Initial PT Evaluation Tier I: 1 Procedure PT Treatments $Gait Training: 8-22 mins   PT G Codes:        Weston Anna, MPT Pager: [phone]
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
## 2014-09-26 NOTE — Progress Notes (Signed)
Physical Therapy Treatment Patient Details Name: Makayla Garcia MRN: [id number] DOB: 1951/02/21 Today's Date: 09/26/2014    History of Present Illness Pt is s/p RIGHT TOTAL KNEE ARTHROPLASTY LEFT KNEE INJECTION     PT Comments    Progressing well. Plan is for d/c home on tomorrow.   Follow Up Recommendations  Home health PT     Equipment Recommendations  None recommended by PT    Recommendations for Other Services       Precautions / Restrictions Precautions Precautions: Knee Required Braces or Orthoses: Knee Immobilizer - Right Knee Immobilizer - Right: Discontinue once straight leg raise with < 10 degree lag Restrictions Weight Bearing Restrictions: No RLE Weight Bearing: Weight bearing as tolerated    Mobility  Bed Mobility Overal bed mobility: Needs Assistance Bed Mobility: Sit to Supine       Sit to supine: Min assist   General bed mobility comments: assist for LE onto bed  Transfers Overall transfer level: Needs assistance Equipment used: Rolling walker (2 wheeled) Transfers: Sit to/from Stand Sit to Stand: Min guard         General transfer comment: close guard for safety. VCs safety, hand placement  Ambulation/Gait Ambulation/Gait assistance: Min guard Ambulation Distance (Feet): 115 Feet Assistive device: Rolling walker (2 wheeled) Gait Pattern/deviations: Step-to pattern     General Gait Details: VCs safety, seqeunce.    Stairs            Wheelchair Mobility    Modified Rankin (Stroke Patients Only)       Balance                                    Cognition Arousal/Alertness: Awake/alert Behavior During Therapy: WFL for tasks assessed/performed Overall Cognitive Status: Within Functional Limits for tasks assessed                      Exercises T   General Comments        Pertinent Vitals/Pain Pain Assessment: 0-10 Pain Score: 5  Pain Location: R knee Pain Descriptors / Indicators: Aching Pain  Intervention(s): Monitored during session;Ice applied;Repositioned    Home Living                      Prior Function            PT Goals (current goals can now be found in the care plan section) Acute Rehab PT Goals Patient Stated Goal: become more independent. PT Goal Formulation: With patient Time For Goal Achievement: 10/03/14 Potential to Achieve Goals: Good Progress towards PT goals: Progressing toward goals    Frequency  7X/week    PT Plan Current plan remains appropriate    Co-evaluation             End of Session Equipment Utilized During Treatment: Gait belt;Right knee immobilizer Activity Tolerance: Patient tolerated treatment well Patient left: in bed;with call bell/phone within reach;with family/visitor present     Time: 1342-1401 PT Time Calculation (min) (ACUTE ONLY): 19 min  Charges:  $Gait Training: 8-22 mins                    G Codes:      Weston Anna, MPT Pager: [phone]

## 2014-09-26 NOTE — Care Management Note (Addendum)
    Page 1 of 1   09/26/2014     2:46:41 PM CARE MANAGEMENT NOTE 09/26/2014  Patient:  Makayla Garcia, Makayla Garcia   Account Number:  [id number]  Date Initiated:  09/26/2014  Documentation initiated by:  [facility]  Subjective/Objective Assessment:   ADM: RIGHT TOTAL KNEE ARTHROPLASTY LEFT KNEE INJECTION (Right)     Action/Plan:   DISCHARGE planning   Anticipated DC Date:  09/27/2014   Anticipated DC Plan:  [place]  CM consult      [facility] Choice  HOME HEALTH   Choice offered to / List presented to:  C-1 Patient        [place] arranged  HH-2 PT      [place]   Status of service:  Completed, signed off Medicare Important Message given?   (If response is "NO", the following Medicare IM given date fields will be blank) Date Medicare IM given:   Medicare IM given by:   Date Additional Medicare IM given:   Additional Medicare IM given by:    Discharge Disposition:  [place]  Per UR Regulation:  Reviewed for med. necessity/level of care/duration of stay  If discussed at [place] of Stay Meetings, dates discussed:    Comments:  09/26/14 09:15 Cm met with pt in room to offer choice of hoe health agency.  Pt chooses [facility] to render HHPT.  Address and contact informatin verified  by pt.  No DME is needed. referral give to [employer], Tim. No other CM needs were communicated.  Mariane Masters, BSn, Cm [phone].
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
## 2014-09-27 LAB — CBC
HCT: 34.3 % — ABNORMAL LOW (ref 36.0–46.0)
Hemoglobin: 11 g/dL — ABNORMAL LOW (ref 12.0–15.0)
MCH: 28.8 pg (ref 26.0–34.0)
MCHC: 32.1 g/dL (ref 30.0–36.0)
MCV: 89.8 fL (ref 78.0–100.0)
Platelets: 204 10*3/uL (ref 150–400)
RBC: 3.82 MIL/uL — ABNORMAL LOW (ref 3.87–5.11)
RDW: 13.5 % (ref 11.5–15.5)
WBC: 12.6 10*3/uL — ABNORMAL HIGH (ref 4.0–10.5)

## 2014-09-27 LAB — BASIC METABOLIC PANEL
Anion gap: 4 — ABNORMAL LOW (ref 5–15)
BUN: 13 mg/dL (ref 6–23)
CO2: 28 mmol/L (ref 19–32)
Calcium: 8.5 mg/dL (ref 8.4–10.5)
Chloride: 108 mmol/L (ref 96–112)
Creatinine, Ser: 0.67 mg/dL (ref 0.50–1.10)
GFR calc Af Amer: >90 mL/min (ref >90)
GFR calc non Af Amer: >90 mL/min (ref >90)
Glucose, Bld: 125 mg/dL — ABNORMAL HIGH (ref 70–99)
Potassium: 4 mmol/L (ref 3.5–5.1)
Sodium: 140 mmol/L (ref 135–145)

## 2014-09-27 NOTE — Progress Notes (Addendum)
Physical Therapy Treatment Patient Details Name: Makayla Garcia MRN: [id number] DOB: 04-11-1951 Today's Date: 09/27/2014    History of Present Illness Pt is s/p RIGHT TOTAL KNEE ARTHROPLASTY LEFT KNEE INJECTION     PT Comments    Progressing well with mobility. All education completed. Ready to d/c from PT standpoint-made secretary aware.  Follow Up Recommendations  Home health PT     Equipment Recommendations  None recommended by PT    Recommendations for Other Services       Precautions / Restrictions Precautions Precautions: Knee Required Braces or Orthoses: Knee Immobilizer - Right Knee Immobilizer - Right: Discontinue once straight leg raise with < 10 degree lag Restrictions Weight Bearing Restrictions: No RLE Weight Bearing: Weight bearing as tolerated    Mobility  Bed Mobility               General bed mobility comments: pt oob in recliner  Transfers Overall transfer level: Needs assistance Equipment used: Rolling walker (2 wheeled) Transfers: Sit to/from Stand Sit to Stand: Min guard         General transfer comment: close guard for safety  Ambulation/Gait Ambulation/Gait assistance: Supervision Ambulation Distance (Feet): 140 Feet Assistive device: Rolling walker (2 wheeled) Gait Pattern/deviations: Step-to pattern         Stairs            Wheelchair Mobility    Modified Rankin (Stroke Patients Only)       Balance                                    Cognition Arousal/Alertness: Awake/alert Behavior During Therapy: WFL for tasks assessed/performed Overall Cognitive Status: Within Functional Limits for tasks assessed                      Exercises Total Joint Exercises Ankle Circles/Pumps: AROM;Both;10 reps;Supine Quad Sets: AROM;Both;10 reps;Supine Heel Slides: AAROM;Right;10 reps;Supine Hip ABduction/ADduction: AAROM;Right;10 reps;Supine Straight Leg Raises: AAROM;Right;10 reps;Supine Goniometric  ROM: 10-50 degrees    General Comments        Pertinent Vitals/Pain Pain Assessment: 0-10 Pain Score: 4  Pain Location: R knee Pain Descriptors / Indicators: Aching Pain Intervention(s): Monitored during session;Ice applied    Home Living                      Prior Function            PT Goals (current goals can now be found in the care plan section) Progress towards PT goals: Progressing toward goals    Frequency  7X/week    PT Plan Current plan remains appropriate    Co-evaluation             End of Session Equipment Utilized During Treatment: Gait belt;Right knee immobilizer Activity Tolerance: Patient tolerated treatment well Patient left: in chair;with call bell/phone within reach;with family/visitor present     Time: 2751-7001 PT Time Calculation (min) (ACUTE ONLY): 23 min  Charges:  $Gait Training: 8-22 mins $Therapeutic Exercise: 8-22 mins                    G Codes:      Weston Anna, MPT Pager: [phone]

## 2014-09-27 NOTE — Progress Notes (Addendum)
Occupational Therapy Treatment Patient Details Name: Makayla Garcia MRN: [id number] DOB: 11-10-1950 Today's Date: 09/27/2014    History of present illness Pt is s/p RIGHT TOTAL KNEE ARTHROPLASTY LEFT KNEE INJECTION    OT comments  Pt doing well. Supposed to d/c. Practiced with AE for LB self care, shower transfer and education on [place] again. Pt and spouse verbalize understanding of all education. Feel pt will progress with additional practice as she understands techniques and husband can assist also.    Follow Up Recommendations  No OT follow up;Supervision/Assistance - 24 hour    Equipment Recommendations  None recommended by OT    Recommendations for Other Services      Precautions / Restrictions Precautions Precautions: Knee Required Braces or Orthoses: Knee Immobilizer - Right Knee Immobilizer - Right: Discontinue once straight leg raise with < 10 degree lag Restrictions Weight Bearing Restrictions: No RLE Weight Bearing: Weight bearing as tolerated       Mobility Bed Mobility                  Transfers Overall transfer level: Needs assistance Equipment used: Rolling walker (2 wheeled) Transfers: Sit to/from Stand Sit to Stand: Min guard         General transfer comment: verbal cues for hand placement.    Balance                                   ADL                       Lower Body Dressing: Moderate assistance;With adaptive equipment Lower Body Dressing Details (indicate cue type and reason): some assist to get pants over shoes that pt already had on to stand and bathe. Needs some assist with R shoe due to ? slight edema/tight fitting shoe. She verbalizes understanding of how to use shoe horn and as knee can bend more to push foot down in shoe. Husband can assist.          Tub/ Shower Transfer: Walk-in shower;Minimal assistance;Rolling walker     General ADL Comments: Pt practiced stepping over the shower ledge with husband  present and with some difficulty clearing L foot back over ledge and then again to step out of shower. Advised pt to sponge bathe another day or two until she can really step back/pick up L LE to clear the height of the shower ledge. Pt agreeable. Demonstrated how to don/doff KI with pt and spouse and when she needs to wear it. Pt practiced with AE and educated on uses. Pt with some difficulty sliding pants over LEs as she already had on her shoes so just needed assist to get pants over shoe but pt verbalizes understanding of techniques used. Pt states she doesnt feel she needs to practice 3in1 further and states she feels comfortable with that.       Vision                     Perception     Praxis      Cognition   Behavior During Therapy: [facility] for tasks assessed/performed Overall Cognitive Status: Within Functional Limits for tasks assessed                       Extremity/Trunk Assessment               Exercises  Shoulder Instructions       General Comments      Pertinent Vitals/ Pain       Pain Assessment: 0-10 Pain Score: 4  Pain Location: R knee Pain Descriptors / Indicators: Aching Pain Intervention(s): Repositioned;Ice applied  Home Living                                          Prior Functioning/Environment              Frequency Min 2X/week     Progress Toward Goals  OT Goals(current goals can now be found in the care plan section)  Progress towards OT goals: Progressing toward goals     Plan Discharge plan remains appropriate    Co-evaluation                 End of Session Equipment Utilized During Treatment: Rolling walker;Right knee immobilizer CPM Right Knee CPM Right Knee: Off   Activity Tolerance Patient tolerated treatment well   Patient Left in chair;with call bell/phone within reach;with family/visitor present   Nurse Communication          Time: 7858-8502 OT Time Calculation  (min): 50 min  Charges: OT General Charges $OT Visit: 1 Procedure OT Treatments $Self Care/Home Management : 23-37 mins $Therapeutic Activity: 8-22 mins  Jules Schick  [phone] 09/27/2014, 11:30 AM

## 2014-09-27 NOTE — Progress Notes (Signed)
   Subjective: 2 Days Post-Op Procedure(s) (LRB): RIGHT TOTAL KNEE ARTHROPLASTY LEFT KNEE INJECTION (Right) Patient reports pain as mild.   Patient seen in rounds with Dr. Wynelle Link.  She states that she is better than yesterday Patient is well, and has had no acute complaints or problems Patient is ready to go home  Objective: Vital signs in last 24 hours: Temp:  [97.6 F (36.4 C)-99 F (37.2 C)] 98.3 F (36.8 C) (02/03 0528) Pulse Rate:  [65-80] 66 (02/03 0528) Resp:  [15-18] 16 (02/03 0528) BP: (109-134)/(59-79) 134/79 mmHg (02/03 0528) SpO2:  [96 %-99 %] 96 % (02/03 0528)  Intake/Output from previous day:  Intake/Output Summary (Last 24 hours) at 09/27/14 0726 Last data filed at 09/27/14 0600  Gross per 24 hour  Intake 1724.08 ml  Output   2000 ml  Net -275.92 ml     Labs:  Recent Labs  09/26/14 0440 09/27/14 0510  HGB 11.6* 11.0*    Recent Labs  09/26/14 0440 09/27/14 0510  WBC 13.8* 12.6*  RBC 3.99 3.82*  HCT 35.7* 34.3*  PLT 215 204    Recent Labs  09/26/14 0440 09/27/14 0510  NA 140 140  K 3.8 4.0  CL 107 108  CO2 24 28  BUN 9 13  CREATININE 0.60 0.67  GLUCOSE 157* 125*  CALCIUM 8.4 8.5   No results for input(s): LABPT, INR in the last 72 hours.  EXAM: General - Patient is Alert, Appropriate and Oriented Extremity - Neurovascular intact Sensation intact distally Dorsiflexion/Plantar flexion intact Incision - clean, dry, no drainage Motor Function - intact, moving foot and toes well on exam.   Assessment/Plan: 2 Days Post-Op Procedure(s) (LRB): RIGHT TOTAL KNEE ARTHROPLASTY LEFT KNEE INJECTION (Right) Procedure(s) (LRB): RIGHT TOTAL KNEE ARTHROPLASTY LEFT KNEE INJECTION (Right) Past Medical History  Diagnosis Date  . Peripheral vascular disease     vein crushed in left leg several years ago not currently a problem   . Asthma   . Headache     hx of migraines  . Arthritis    Principal Problem:   OA (osteoarthritis) of  knee  Estimated body mass index is 33.13 kg/(m^2) as calculated from the following:   Height as of this encounter: 5\' 3"  (1.6 m).   Weight as of this encounter: 84.823 kg (187 lb). Up with therapy Discharge home with home health Diet - Regular diet Follow up - in 2 weeks Activity - WBAT Disposition - Home Condition Upon Discharge - Good D/C Meds - See DC Summary DVT Prophylaxis - Xarelto  Arlee Muslim, PA-C Orthopaedic Surgery 09/27/2014, 7:26 AM

## 2015-01-01 ENCOUNTER — Other Ambulatory Visit (HOSPITAL_COMMUNITY): Payer: Self-pay | Admitting: Family Medicine

## 2015-01-01 ENCOUNTER — Other Ambulatory Visit (HOSPITAL_COMMUNITY)
Admission: RE | Admit: 2015-01-01 | Discharge: 2015-01-01 | Disposition: A | Discharge status: Home / Self Care | Payer: 59 | Source: Ambulatory Visit | Attending: Family Medicine | Admitting: Family Medicine

## 2015-01-01 DIAGNOSIS — Z01419 Encounter for gynecological examination (general) (routine) without abnormal findings: Secondary | ICD-10-CM | POA: Insufficient documentation

## 2015-01-03 LAB — CYTOLOGY - PAP

## 2015-02-15 ENCOUNTER — Other Ambulatory Visit: Payer: Self-pay | Admitting: Gastroenterology

## 2016-08-13 DIAGNOSIS — M1712 Unilateral primary osteoarthritis, left knee: Secondary | ICD-10-CM | POA: Diagnosis not present

## 2016-08-14 DIAGNOSIS — H5203 Hypermetropia, bilateral: Secondary | ICD-10-CM | POA: Diagnosis not present

## 2016-08-14 DIAGNOSIS — H35372 Puckering of macula, left eye: Secondary | ICD-10-CM | POA: Diagnosis not present

## 2016-08-14 DIAGNOSIS — H524 Presbyopia: Secondary | ICD-10-CM | POA: Diagnosis not present

## 2016-09-05 DIAGNOSIS — R42 Dizziness and giddiness: Secondary | ICD-10-CM | POA: Diagnosis not present

## 2016-09-05 DIAGNOSIS — J0100 Acute maxillary sinusitis, unspecified: Secondary | ICD-10-CM | POA: Diagnosis not present

## 2016-10-29 DIAGNOSIS — M1712 Unilateral primary osteoarthritis, left knee: Secondary | ICD-10-CM | POA: Diagnosis not present

## 2016-11-05 DIAGNOSIS — M1712 Unilateral primary osteoarthritis, left knee: Secondary | ICD-10-CM | POA: Diagnosis not present

## 2016-11-12 DIAGNOSIS — M1712 Unilateral primary osteoarthritis, left knee: Secondary | ICD-10-CM | POA: Diagnosis not present

## 2016-12-26 DIAGNOSIS — M1712 Unilateral primary osteoarthritis, left knee: Secondary | ICD-10-CM | POA: Diagnosis not present

## 2017-02-09 DIAGNOSIS — S8992XA Unspecified injury of left lower leg, initial encounter: Secondary | ICD-10-CM | POA: Diagnosis not present

## 2017-02-09 DIAGNOSIS — Z9181 History of falling: Secondary | ICD-10-CM | POA: Diagnosis not present

## 2017-02-09 DIAGNOSIS — T148XXA Other injury of unspecified body region, initial encounter: Secondary | ICD-10-CM | POA: Diagnosis not present

## 2017-02-11 ENCOUNTER — Ambulatory Visit
Admission: RE | Admit: 2017-02-11 | Discharge: 2017-02-11 | Disposition: A | Discharge status: Home / Self Care | Payer: Medicare Other | Source: Ambulatory Visit | Attending: Family Medicine | Admitting: Family Medicine

## 2017-02-11 ENCOUNTER — Other Ambulatory Visit: Payer: Self-pay | Admitting: Family Medicine

## 2017-02-11 DIAGNOSIS — S8992XA Unspecified injury of left lower leg, initial encounter: Secondary | ICD-10-CM

## 2017-02-11 DIAGNOSIS — M7989 Other specified soft tissue disorders: Secondary | ICD-10-CM | POA: Diagnosis not present

## 2017-05-04 DIAGNOSIS — M25541 Pain in joints of right hand: Secondary | ICD-10-CM | POA: Diagnosis not present

## 2017-05-04 DIAGNOSIS — Z23 Encounter for immunization: Secondary | ICD-10-CM | POA: Diagnosis not present

## 2017-05-04 DIAGNOSIS — L719 Rosacea, unspecified: Secondary | ICD-10-CM | POA: Diagnosis not present

## 2017-05-04 DIAGNOSIS — M859 Disorder of bone density and structure, unspecified: Secondary | ICD-10-CM | POA: Diagnosis not present

## 2017-05-04 DIAGNOSIS — M25542 Pain in joints of left hand: Secondary | ICD-10-CM | POA: Diagnosis not present

## 2017-05-10 DIAGNOSIS — L259 Unspecified contact dermatitis, unspecified cause: Secondary | ICD-10-CM | POA: Diagnosis not present

## 2017-05-22 DIAGNOSIS — M1712 Unilateral primary osteoarthritis, left knee: Secondary | ICD-10-CM | POA: Diagnosis not present

## 2017-05-29 DIAGNOSIS — M1712 Unilateral primary osteoarthritis, left knee: Secondary | ICD-10-CM | POA: Diagnosis not present

## 2017-06-04 DIAGNOSIS — M1712 Unilateral primary osteoarthritis, left knee: Secondary | ICD-10-CM | POA: Diagnosis not present

## 2017-08-05 DIAGNOSIS — H52223 Regular astigmatism, bilateral: Secondary | ICD-10-CM | POA: Diagnosis not present

## 2017-08-05 DIAGNOSIS — H5203 Hypermetropia, bilateral: Secondary | ICD-10-CM | POA: Diagnosis not present

## 2017-08-05 DIAGNOSIS — H35372 Puckering of macula, left eye: Secondary | ICD-10-CM | POA: Diagnosis not present

## 2017-08-05 DIAGNOSIS — H524 Presbyopia: Secondary | ICD-10-CM | POA: Diagnosis not present

## 2017-08-13 DIAGNOSIS — M810 Age-related osteoporosis without current pathological fracture: Secondary | ICD-10-CM | POA: Diagnosis not present

## 2017-08-27 DIAGNOSIS — Z23 Encounter for immunization: Secondary | ICD-10-CM | POA: Diagnosis not present

## 2017-08-27 DIAGNOSIS — Z0000 Encounter for general adult medical examination without abnormal findings: Secondary | ICD-10-CM | POA: Diagnosis not present

## 2017-08-27 DIAGNOSIS — M859 Disorder of bone density and structure, unspecified: Secondary | ICD-10-CM | POA: Diagnosis not present

## 2017-08-27 DIAGNOSIS — E7800 Pure hypercholesterolemia, unspecified: Secondary | ICD-10-CM | POA: Diagnosis not present

## 2017-08-27 DIAGNOSIS — L719 Rosacea, unspecified: Secondary | ICD-10-CM | POA: Diagnosis not present

## 2017-08-27 DIAGNOSIS — M810 Age-related osteoporosis without current pathological fracture: Secondary | ICD-10-CM | POA: Diagnosis not present

## 2017-09-03 ENCOUNTER — Other Ambulatory Visit: Payer: Self-pay | Admitting: Family Medicine

## 2017-09-03 DIAGNOSIS — Z139 Encounter for screening, unspecified: Secondary | ICD-10-CM

## 2017-09-28 ENCOUNTER — Ambulatory Visit: Payer: Medicare Other

## 2017-10-14 ENCOUNTER — Ambulatory Visit
Admission: RE | Admit: 2017-10-14 | Discharge: 2017-10-14 | Disposition: A | Discharge status: Home / Self Care | Payer: Medicare Other | Source: Ambulatory Visit | Attending: Family Medicine | Admitting: Family Medicine

## 2017-10-14 DIAGNOSIS — Z139 Encounter for screening, unspecified: Secondary | ICD-10-CM

## 2017-10-14 DIAGNOSIS — Z1231 Encounter for screening mammogram for malignant neoplasm of breast: Secondary | ICD-10-CM | POA: Diagnosis not present

## 2017-11-20 DIAGNOSIS — Z96651 Presence of right artificial knee joint: Secondary | ICD-10-CM | POA: Diagnosis not present

## 2017-11-20 DIAGNOSIS — M1712 Unilateral primary osteoarthritis, left knee: Secondary | ICD-10-CM | POA: Diagnosis not present

## 2017-12-10 DIAGNOSIS — M1990 Unspecified osteoarthritis, unspecified site: Secondary | ICD-10-CM | POA: Diagnosis not present

## 2017-12-10 DIAGNOSIS — M1712 Unilateral primary osteoarthritis, left knee: Secondary | ICD-10-CM | POA: Diagnosis not present

## 2017-12-17 DIAGNOSIS — M1712 Unilateral primary osteoarthritis, left knee: Secondary | ICD-10-CM | POA: Diagnosis not present

## 2017-12-25 DIAGNOSIS — M1712 Unilateral primary osteoarthritis, left knee: Secondary | ICD-10-CM | POA: Diagnosis not present

## 2018-04-15 DIAGNOSIS — M1712 Unilateral primary osteoarthritis, left knee: Secondary | ICD-10-CM | POA: Diagnosis not present

## 2018-05-10 DIAGNOSIS — H35372 Puckering of macula, left eye: Secondary | ICD-10-CM | POA: Diagnosis not present

## 2018-05-10 DIAGNOSIS — H35352 Cystoid macular degeneration, left eye: Secondary | ICD-10-CM | POA: Diagnosis not present

## 2018-06-08 DIAGNOSIS — H35372 Puckering of macula, left eye: Secondary | ICD-10-CM | POA: Diagnosis not present

## 2018-06-08 DIAGNOSIS — H43813 Vitreous degeneration, bilateral: Secondary | ICD-10-CM | POA: Diagnosis not present

## 2018-06-08 DIAGNOSIS — H2513 Age-related nuclear cataract, bilateral: Secondary | ICD-10-CM | POA: Diagnosis not present

## 2018-06-08 DIAGNOSIS — H43821 Vitreomacular adhesion, right eye: Secondary | ICD-10-CM | POA: Diagnosis not present

## 2018-07-08 DIAGNOSIS — M1712 Unilateral primary osteoarthritis, left knee: Secondary | ICD-10-CM | POA: Diagnosis not present

## 2018-09-08 ENCOUNTER — Other Ambulatory Visit: Payer: Self-pay | Admitting: Family Medicine

## 2018-09-08 ENCOUNTER — Other Ambulatory Visit (HOSPITAL_COMMUNITY)
Admission: RE | Admit: 2018-09-08 | Discharge: 2018-09-08 | Disposition: A | Discharge status: Home / Self Care | Payer: Medicare Other | Source: Ambulatory Visit | Attending: Family Medicine | Admitting: Family Medicine

## 2018-09-08 DIAGNOSIS — Z0000 Encounter for general adult medical examination without abnormal findings: Secondary | ICD-10-CM | POA: Diagnosis not present

## 2018-09-08 DIAGNOSIS — Z01818 Encounter for other preprocedural examination: Secondary | ICD-10-CM | POA: Diagnosis not present

## 2018-09-08 DIAGNOSIS — M810 Age-related osteoporosis without current pathological fracture: Secondary | ICD-10-CM | POA: Diagnosis not present

## 2018-09-08 DIAGNOSIS — Z124 Encounter for screening for malignant neoplasm of cervix: Secondary | ICD-10-CM | POA: Diagnosis not present

## 2018-09-08 DIAGNOSIS — Z1231 Encounter for screening mammogram for malignant neoplasm of breast: Secondary | ICD-10-CM

## 2018-09-08 DIAGNOSIS — Z1389 Encounter for screening for other disorder: Secondary | ICD-10-CM | POA: Diagnosis not present

## 2018-09-08 DIAGNOSIS — E7800 Pure hypercholesterolemia, unspecified: Secondary | ICD-10-CM | POA: Diagnosis not present

## 2018-09-08 DIAGNOSIS — I4510 Unspecified right bundle-branch block: Secondary | ICD-10-CM | POA: Diagnosis not present

## 2018-09-08 DIAGNOSIS — Z23 Encounter for immunization: Secondary | ICD-10-CM | POA: Diagnosis not present

## 2018-09-13 LAB — CYTOLOGY - PAP
Diagnosis: NEGATIVE
HPV: NOT DETECTED

## 2018-09-14 DIAGNOSIS — Z01810 Encounter for preprocedural cardiovascular examination: Secondary | ICD-10-CM | POA: Diagnosis not present

## 2018-09-14 DIAGNOSIS — M1712 Unilateral primary osteoarthritis, left knee: Secondary | ICD-10-CM | POA: Diagnosis not present

## 2018-09-14 DIAGNOSIS — I358 Other nonrheumatic aortic valve disorders: Secondary | ICD-10-CM | POA: Diagnosis not present

## 2018-09-14 DIAGNOSIS — E785 Hyperlipidemia, unspecified: Secondary | ICD-10-CM | POA: Diagnosis not present

## 2018-09-20 NOTE — Patient Instructions (Signed)
Makayla Garcia  09/20/2018   Your procedure is scheduled on: 09-27-18    Report to [facility] Main  Entrance    Report to Admitting at 12:20 PM    Call this number if you have problems the morning of surgery [phone]    Remember: Do not eat food or drink liquids :After Midnight. You may have a Clear Liquid from Midnight until 8:50 AM. After 8:50 AM, nothing by mouth.   BRUSH YOUR TEETH MORNING OF SURGERY AND RINSE YOUR MOUTH OUT, NO CHEWING GUM CANDY OR MINTS.     CLEAR LIQUID DIET   Foods Allowed                                                                     Foods Excluded  Coffee and tea, regular and decaf                             liquids that you cannot  Plain Jell-O in any flavor                                             see through such as: Fruit ices (not with fruit pulp)                                     milk, soups, orange juice  Iced Popsicles                                    All solid food Carbonated beverages, regular and diet                                    Cranberry, grape and apple juices Sports drinks like Gatorade Lightly seasoned clear broth or consume(fat free) Sugar, honey syrup  Sample Menu Breakfast                                Lunch                                     Supper Cranberry juice                    Beef broth                            Chicken broth Jell-O                                     Grape juice  Apple juice Coffee or tea                        Jell-O                                      Popsicle                                                Coffee or tea                        Coffee or tea  _____________________________________________________________________     Take these medicines the morning of surgery with A SIP OF WATER: Cetirizine (Zyrtec), Esomeprazole (Nexium). You may have use and bring your nasal spray as needed.                                 You  may not have any metal on your body including hair pins and              piercings  Do not wear jewelry, make-up, lotions, powders or perfumes, deodorant             Do not wear nail polish.  Do not shave  48 hours prior to surgery.                Do not bring valuables to the hospital. [place].  Contacts, dentures or bridgework may not be worn into surgery.  Leave suitcase in the car. After surgery it may be brought to your room.     Patients discharged the day of surgery will not be allowed to drive home. IF YOU ARE HAVING SURGERY AND GOING HOME THE SAME DAY, YOU MUST HAVE AN ADULT TO DRIVE YOU HOME AND BE WITH YOU FOR 24 HOURS. YOU MAY GO HOME BY TAXI OR UBER OR ORTHERWISE, BUT AN ADULT MUST ACCOMPANY YOU HOME AND STAY WITH YOU FOR 24 HOURS.   Special Instructions: N/A              Please read over the following fact sheets you were given: _____________________________________________________________________             [facility] - Preparing for Surgery Before surgery, you can play an important role.  Because skin is not sterile, your skin needs to be as free of germs as possible.  You can reduce the number of germs on your skin by washing with CHG (chlorahexidine gluconate) soap before surgery.  CHG is an antiseptic cleaner which kills germs and bonds with the skin to continue killing germs even after washing. Please DO NOT use if you have an allergy to CHG or antibacterial soaps.  If your skin becomes reddened/irritated stop using the CHG and inform your nurse when you arrive at Short Stay. Do not shave (including legs and underarms) for at least 48 hours prior to the first CHG shower.  You may shave your face/neck. Please follow these instructions carefully:  1.  Shower with CHG Soap the night before surgery and the  morning of Surgery.  2.  If you choose to wash your hair, wash your hair first as usual with your  normal   shampoo.  3.  After you shampoo, rinse your hair and body thoroughly to remove the  shampoo.                           4.  Use CHG as you would any other liquid soap.  You can apply chg directly  to the skin and wash                       Gently with a scrungie or clean washcloth.  5.  Apply the CHG Soap to your body ONLY FROM THE NECK DOWN.   Do not use on face/ open                           Wound or open sores. Avoid contact with eyes, ears mouth and genitals (private parts).                       Wash face,  Genitals (private parts) with your normal soap.             6.  Wash thoroughly, paying special attention to the area where your surgery  will be performed.  7.  Thoroughly rinse your body with warm water from the neck down.  8.  DO NOT shower/wash with your normal soap after using and rinsing off  the CHG Soap.                9.  Pat yourself dry with a clean towel.            10.  Wear clean pajamas.            11.  Place clean sheets on your bed the night of your first shower and do not  sleep with pets. Day of Surgery : Do not apply any lotions/deodorants the morning of surgery.  Please wear clean clothes to the hospital/surgery center.  FAILURE TO FOLLOW THESE INSTRUCTIONS MAY RESULT IN THE CANCELLATION OF YOUR SURGERY PATIENT SIGNATURE_________________________________  NURSE SIGNATURE__________________________________  ________________________________________________________________________   Adam Phenix  An incentive spirometer is a tool that can help keep your lungs clear and active. This tool measures how well you are filling your lungs with each breath. Taking long deep breaths may help reverse or decrease the chance of developing breathing (pulmonary) problems (especially infection) following:  A long period of time when you are unable to move or be active. BEFORE THE PROCEDURE   If the spirometer includes an indicator to show your best effort, your nurse or  respiratory therapist will set it to a desired goal.  If possible, sit up straight or lean slightly forward. Try not to slouch.  Hold the incentive spirometer in an upright position. INSTRUCTIONS FOR USE  1. Sit on the edge of your bed if possible, or sit up as far as you can in bed or on a chair. 2. Hold the incentive spirometer in an upright position. 3. Breathe out normally. 4. Place the mouthpiece in your mouth and seal your lips tightly around it. 5. Breathe in slowly and as deeply as possible, raising the piston or the ball toward the top of the column. 6. Hold your breath for 3-5 seconds or for as long as  possible. Allow the piston or ball to fall to the bottom of the column. 7. Remove the mouthpiece from your mouth and breathe out normally. 8. Rest for a few seconds and repeat Steps 1 through 7 at least 10 times every 1-2 hours when you are awake. Take your time and take a few normal breaths between deep breaths. 9. The spirometer may include an indicator to show your best effort. Use the indicator as a goal to work toward during each repetition. 10. After each set of 10 deep breaths, practice coughing to be sure your lungs are clear. If you have an incision (the cut made at the time of surgery), support your incision when coughing by placing a pillow or rolled up towels firmly against it. Once you are able to get out of bed, walk around indoors and cough well. You may stop using the incentive spirometer when instructed by your caregiver.  RISKS AND COMPLICATIONS  Take your time so you do not get dizzy or light-headed.  If you are in pain, you may need to take or ask for pain medication before doing incentive spirometry. It is harder to take a deep breath if you are having pain. AFTER USE  Rest and breathe slowly and easily.  It can be helpful to keep track of a log of your progress. Your caregiver can provide you with a simple table to help with this. If you are using the  spirometer at home, follow these instructions: [place] IF:   You are having difficultly using the spirometer.  You have trouble using the spirometer as often as instructed.  Your pain medication is not giving enough relief while using the spirometer.  You develop fever of 100.5 F (38.1 C) or higher. SEEK IMMEDIATE MEDICAL CARE IF:   You cough up bloody sputum that had not been present before.  You develop fever of 102 F (38.9 C) or greater.  You develop worsening pain at or near the incision site. MAKE SURE YOU:   Understand these instructions.  Will watch your condition.  Will get help right away if you are not doing well or get worse. Document Released: 12/22/2006 Document Revised: 11/03/2011 Document Reviewed: 02/22/2007 ExitCare Patient Information 2014 ExitCare, [place].   ________________________________________________________________________  WHAT IS A BLOOD TRANSFUSION? Blood Transfusion Information  A transfusion is the replacement of blood or some of its parts. Blood is made up of multiple cells which provide different functions.  Red blood cells carry oxygen and are used for blood loss replacement.  White blood cells fight against infection.  Platelets control bleeding.  Plasma helps clot blood.  Other blood products are available for specialized needs, such as hemophilia or other clotting disorders. BEFORE THE TRANSFUSION  Who gives blood for transfusions?   Healthy volunteers who are fully evaluated to make sure their blood is safe. This is blood bank blood. Transfusion therapy is the safest it has ever been in the practice of medicine. Before blood is taken from a donor, a complete history is taken to make sure that person has no history of diseases nor engages in risky social behavior (examples are intravenous drug use or sexual activity with multiple partners). The donor's travel history is screened to minimize risk of transmitting  infections, such as malaria. The donated blood is tested for signs of infectious diseases, such as HIV and hepatitis. The blood is then tested to be sure it is compatible with you in order to minimize the chance of a  transfusion reaction. If you or a relative donates blood, this is often done in anticipation of surgery and is not appropriate for emergency situations. It takes many days to process the donated blood. RISKS AND COMPLICATIONS Although transfusion therapy is very safe and saves many lives, the main dangers of transfusion include:   Getting an infectious disease.  Developing a transfusion reaction. This is an allergic reaction to something in the blood you were given. Every precaution is taken to prevent this. The decision to have a blood transfusion has been considered carefully by your caregiver before blood is given. Blood is not given unless the benefits outweigh the risks. AFTER THE TRANSFUSION  Right after receiving a blood transfusion, you will usually feel much better and more energetic. This is especially true if your red blood cells have gotten low (anemic). The transfusion raises the level of the red blood cells which carry oxygen, and this usually causes an energy increase.  The nurse administering the transfusion will monitor you carefully for complications. HOME CARE INSTRUCTIONS  No special instructions are needed after a transfusion. You may find your energy is better. Speak with your caregiver about any limitations on activity for underlying diseases you may have. SEEK MEDICAL CARE IF:   Your condition is not improving after your transfusion.  You develop redness or irritation at the intravenous (IV) site. SEEK IMMEDIATE MEDICAL CARE IF:  Any of the following symptoms occur over the next 12 hours:  Shaking chills.  You have a temperature by mouth above 102 F (38.9 C), not controlled by medicine.  Chest, back, or muscle pain.  People around you feel you are  not acting correctly or are confused.  Shortness of breath or difficulty breathing.  Dizziness and fainting.  You get a rash or develop hives.  You have a decrease in urine output.  Your urine turns a dark color or changes to pink, red, or brown. Any of the following symptoms occur over the next 10 days:  You have a temperature by mouth above 102 F (38.9 C), not controlled by medicine.  Shortness of breath.  Weakness after normal activity.  The white part of the eye turns yellow (jaundice).  You have a decrease in the amount of urine or are urinating less often.  Your urine turns a dark color or changes to pink, red, or brown. Document Released: 08/08/2000 Document Revised: 11/03/2011 Document Reviewed: 03/27/2008 [facility] Patient Information 2014 [place], [place].  _______________________________________________________________________

## 2018-09-20 NOTE — Progress Notes (Addendum)
  09-14-18 Cardiac Clearance on chart  09-09-18 Surgical Clearance from Dr. Tamala Julian on chart along with EKG

## 2018-09-20 NOTE — H&P (Signed)
TOTAL KNEE ADMISSION H&P  Patient is being admitted for left total knee arthroplasty.  Subjective:  Chief Complaint:left knee pain.  HPI: Makayla Garcia, 68 y.o. female, has a history of pain and functional disability in the left knee due to arthritis and has failed non-surgical conservative treatments for greater than 12 weeks to includecorticosteriod injections, viscosupplementation injections and activity modification.  Onset of symptoms was gradual, starting 2 years ago with gradually worsening course since that time. The patient noted no past surgery on the left knee(s).  Patient currently rates pain in the left knee(s) at 7 out of 10 with activity. Patient has worsening of pain with activity and weight bearing, crepitus and joint swelling.  Patient has evidence of bone-on-bone arthritis in the medial and patellofemoral compartments.  by imaging studies. There is no active infection.  Patient Active Problem List   Diagnosis Date Noted  . OA (osteoarthritis) of knee 09/25/2014  . GERD 10/30/2009  . KNEE PAIN 10/30/2009  . FINGER PAIN 10/30/2009  . PLANTAR FASCIITIS 05/25/2008  . OSTEOPENIA 12/01/2007  . FEVER BLISTER 11/23/2006  . ALLERGIC RHINITIS 11/23/2006  . ASTHMA 11/23/2006  . FIBROCYSTIC BREAST DISEASE 11/23/2006   Past Medical History:  Diagnosis Date  . Arthritis   . Asthma   . Headache    hx of migraines  . Peripheral vascular disease ([place])    vein crushed in left leg several years ago not currently a problem     Past Surgical History:  Procedure Laterality Date  . cesearean section      x 2   . right hand surgery      to remove cyst   . right knee arthroscopy     . TONSILLECTOMY    . TOTAL KNEE ARTHROPLASTY Right 09/25/2014   Procedure: RIGHT TOTAL KNEE ARTHROPLASTY LEFT KNEE INJECTION;  Surgeon: Gearlean Alf, MD;  Location: [facility];  Service: Orthopedics;  Laterality: Right;  . WISDOM TOOTH EXTRACTION      No current facility-administered medications for this  encounter.    Current Outpatient Medications  Medication Sig Dispense Refill Last Dose  . acetaminophen (TYLENOL) 650 MG CR tablet Take 650 mg by mouth every 8 (eight) hours as needed for pain.     . cetirizine (ZYRTEC) 10 MG tablet Take 10 mg by mouth daily.     . Cholecalciferol (VITAMIN D) 50 MCG (2000 UT) CAPS Take 2,000 Units by mouth daily.     Marland Kitchen esomeprazole (NEXIUM) 20 MG capsule Take 20 mg by mouth daily.     . fluticasone (FLONASE) 50 MCG/ACT nasal spray Place 2 sprays into both nostrils daily.    09/25/2014 at 0800  . Multiple Vitamin (MULTIVITAMIN WITH MINERALS) TABS tablet Take 1 tablet by mouth daily.     . traMADol (ULTRAM) 50 MG tablet Take 1-2 tablets (50-100 mg total) by mouth every 6 (six) hours as needed (mild pain). (Patient not taking: Reported on 09/09/2018) 60 tablet 1 Not Taking at Unknown time   Allergies  Allergen Reactions  . Percocet [Oxycodone-Acetaminophen]     Severe stomach pain  . Promethazine Hcl     severe stomach pain  . Sulfonamide Derivatives Rash    Social History   Tobacco Use  . Smoking status: Never Smoker  . Smokeless tobacco: Never Used  Substance Use Topics  . Alcohol use: Yes    Comment: rare    No family history on file.   Review of Systems  Constitutional: Negative for chills and fever.  HENT: Negative for congestion, sore throat and tinnitus.   Eyes: Negative for double vision, photophobia and pain.  Respiratory: Negative for cough, shortness of breath and wheezing.   Cardiovascular: Negative for chest pain, palpitations and orthopnea.  Gastrointestinal: Negative for heartburn, nausea and vomiting.  Genitourinary: Negative for dysuria, frequency and urgency.  Musculoskeletal: Positive for joint pain.  Neurological: Negative for dizziness, weakness and headaches.    Objective:  Physical Exam  Well nourished and well developed.  General: Alert and oriented x3, cooperative and pleasant, no acute distress.  Head:  normocephalic, atraumatic, neck supple.  Eyes: EOMI.  Respiratory: breath sounds clear in all fields, no wheezing, rales, or rhonchi. Cardiovascular: Regular rate and rhythm, no murmurs, gallops or rubs.  Abdomen: non-tender to palpation and soft, normoactive bowel sounds. Musculoskeletal:  Left Knee Exam: Varus deformity. No effusion. No Swelling. Range of motion is 5-130 degrees. Moderate crepitus on range of motion of the knee. Significant medial joint line tenderness. Moderate lateral joint line tenderness. Stable knee.  Calves soft and nontender. Motor function intact in LE. Strength 5/5 LE bilaterally. Neuro: Distal pulses 2+. Sensation to light touch intact in LE.  Vital signs in last 24 hours: Blood pressure: 132/78 mmHg Pulse: 68 bpm   Labs:   Estimated body mass index is 33.13 kg/m as calculated from the following:   Height as of 09/25/14: 5\' 3"  (1.6 m).   Weight as of 09/25/14: 84.8 kg.   Imaging Review Plain radiographs demonstrate severe degenerative joint disease of the left knee(s). The overall alignment isneutral. The bone quality appears to be adequate for age and reported activity level.   Preoperative templating of the joint replacement has been completed, documented, and submitted to the Operating Room personnel in order to optimize intra-operative equipment management.   Anticipated LOS equal to or greater than 2 midnights due to - Age 36 and older with one or more of the following:  - Obesity  - Expected need for hospital services (PT, OT, Nursing) required for safe  discharge  - Anticipated need for postoperative skilled nursing care or inpatient rehab  - Active co-morbidities: None OR   - Unanticipated findings during/Post Surgery: None  - Patient is a high risk of re-admission due to: None     Assessment/Plan:  End stage arthritis, left knee   The patient history, physical examination, clinical judgment of the provider and imaging studies are  consistent with end stage degenerative joint disease of the left knee(s) and total knee arthroplasty is deemed medically necessary. The treatment options including medical management, injection therapy arthroscopy and arthroplasty were discussed at length. The risks and benefits of total knee arthroplasty were presented and reviewed. The risks due to aseptic loosening, infection, stiffness, patella tracking problems, thromboembolic complications and other imponderables were discussed. The patient acknowledged the explanation, agreed to proceed with the plan and consent was signed. Patient is being admitted for inpatient treatment for surgery, pain control, PT, OT, prophylactic antibiotics, VTE prophylaxis, progressive ambulation and ADL's and discharge planning. The patient is planning to be discharged home.   Therapy Plans: outpatient therapy at [facility] Disposition: Home with husband Planned DVT Prophylaxis: Aspirin 325 mg BID DME needed: None PCP: Carol Ada, MD TXA: IV Allergies: Sulfa (rash) Anesthesia Concerns: None BMI: 30.9  - Patient was instructed on what medications to stop prior to surgery. - Follow-up visit in 2 weeks with Dr. Wynelle Link - Begin physical therapy following surgery - Pre-operative lab work as pre-surgical testing - Prescriptions  will be provided in hospital at time of discharge  Theresa Duty, PA-C Orthopedic Surgery [facility] [place]

## 2018-09-21 DIAGNOSIS — D485 Neoplasm of uncertain behavior of skin: Secondary | ICD-10-CM | POA: Diagnosis not present

## 2018-09-21 DIAGNOSIS — D225 Melanocytic nevi of trunk: Secondary | ICD-10-CM | POA: Diagnosis not present

## 2018-09-21 DIAGNOSIS — L820 Inflamed seborrheic keratosis: Secondary | ICD-10-CM | POA: Diagnosis not present

## 2018-09-21 DIAGNOSIS — L821 Other seborrheic keratosis: Secondary | ICD-10-CM | POA: Diagnosis not present

## 2018-09-22 ENCOUNTER — Other Ambulatory Visit: Payer: Self-pay

## 2018-09-22 ENCOUNTER — Encounter (HOSPITAL_COMMUNITY)
Admission: RE | Admit: 2018-09-22 | Discharge: 2018-09-22 | Disposition: A | Discharge status: Home / Self Care | Payer: Medicare Other | Source: Ambulatory Visit | Attending: Orthopedic Surgery | Admitting: Orthopedic Surgery

## 2018-09-22 ENCOUNTER — Encounter (HOSPITAL_COMMUNITY): Payer: Self-pay

## 2018-09-22 DIAGNOSIS — Z01812 Encounter for preprocedural laboratory examination: Secondary | ICD-10-CM | POA: Diagnosis not present

## 2018-09-22 DIAGNOSIS — M1712 Unilateral primary osteoarthritis, left knee: Secondary | ICD-10-CM | POA: Diagnosis not present

## 2018-09-22 LAB — COMPREHENSIVE METABOLIC PANEL
ALT: 43 U/L (ref 0–44)
AST: 31 U/L (ref 15–41)
Albumin: 4.4 g/dL (ref 3.5–5.0)
Alkaline Phosphatase: 99 U/L (ref 38–126)
Anion gap: 7 (ref 5–15)
BUN: 21 mg/dL (ref 8–23)
CO2: 27 mmol/L (ref 22–32)
Calcium: 9 mg/dL (ref 8.9–10.3)
Chloride: 104 mmol/L (ref 98–111)
Creatinine, Ser: 0.8 mg/dL (ref 0.44–1.00)
GFR calc Af Amer: >60 mL/min (ref >60)
GFR calc non Af Amer: >60 mL/min (ref >60)
Glucose, Bld: 107 mg/dL — ABNORMAL HIGH (ref 70–99)
Potassium: 4.6 mmol/L (ref 3.5–5.1)
Sodium: 138 mmol/L (ref 135–145)
Total Bilirubin: 0.9 mg/dL (ref 0.3–1.2)
Total Protein: 7.3 g/dL (ref 6.5–8.1)

## 2018-09-22 LAB — CBC
HCT: 44.6 % (ref 36.0–46.0)
Hemoglobin: 14.1 g/dL (ref 12.0–15.0)
MCH: 28.7 pg (ref 26.0–34.0)
MCHC: 31.6 g/dL (ref 30.0–36.0)
MCV: 90.8 fL (ref 80.0–100.0)
Platelets: 216 10*3/uL (ref 150–400)
RBC: 4.91 MIL/uL (ref 3.87–5.11)
RDW: 12.9 % (ref 11.5–15.5)
WBC: 5.8 10*3/uL (ref 4.0–10.5)
nRBC: 0 % (ref 0.0–0.2)

## 2018-09-22 LAB — APTT: aPTT: 38 seconds — ABNORMAL HIGH (ref 24–36)

## 2018-09-22 LAB — SURGICAL PCR SCREEN
MRSA, PCR: NEGATIVE
Staphylococcus aureus: NEGATIVE

## 2018-09-22 LAB — PROTIME-INR
INR: 0.88
Prothrombin Time: 11.8 seconds (ref 11.4–15.2)

## 2018-09-22 NOTE — Progress Notes (Signed)
09-22-18 PTT result routed to Dr. Anne Fu office for review

## 2018-09-24 DIAGNOSIS — I358 Other nonrheumatic aortic valve disorders: Secondary | ICD-10-CM | POA: Diagnosis not present

## 2018-09-24 NOTE — Anesthesia Preprocedure Evaluation (Addendum)
Anesthesia Evaluation    Airway Mallampati: II  TM Distance: >3 FB Neck ROM: Full    Dental no notable dental hx. (+) Teeth Intact   Pulmonary asthma ,    Pulmonary exam normal breath sounds clear to auscultation       Cardiovascular + Peripheral Vascular Disease  Normal cardiovascular exam Rhythm:Regular Rate:Normal     Neuro/Psych  Headaches, negative psych ROS   GI/Hepatic Neg liver ROS, GERD  Controlled and Medicated,  Endo/Other  Obesity  Renal/GU negative Renal ROS  negative genitourinary   Musculoskeletal  (+) Arthritis , Osteoarthritis,  OA left knee   Abdominal (+) + obese,   Peds  Hematology negative hematology ROS (+)   Anesthesia Other Findings   Reproductive/Obstetrics                             Anesthesia Physical Anesthesia Plan  ASA: II  Anesthesia Plan: Spinal   Post-op Pain Management:  [facility] for Post-op pain   Induction:   PONV Risk Score and Plan: 3 and Ondansetron, Propofol infusion, Treatment may vary due to age or medical condition and Midazolam  Airway Management Planned: Simple Face Mask and Natural Airway  Additional Equipment:   Intra-op Plan:   Post-operative Plan:   Informed Consent: I have reviewed the patients History and Physical, chart, labs and discussed the procedure including the risks, benefits and alternatives for the proposed anesthesia with the patient or authorized representative who has indicated his/her understanding and acceptance.       Plan Discussed with: CRNA and Surgeon  Anesthesia Plan Comments:         Anesthesia Quick Evaluation

## 2018-09-26 MED ORDER — BUPIVACAINE LIPOSOME 1.3 % IJ SUSP
20.0000 mL | Freq: Once | INTRAMUSCULAR | Status: DC
  Filled 2018-09-26: qty 20

## 2018-09-27 ENCOUNTER — Encounter (HOSPITAL_COMMUNITY): Payer: Self-pay | Admitting: *Deleted

## 2018-09-27 ENCOUNTER — Encounter (HOSPITAL_COMMUNITY)
Admission: RE | Disposition: A | Discharge status: Home / Self Care | Payer: Self-pay | Source: Ambulatory Visit | Attending: Orthopedic Surgery

## 2018-09-27 ENCOUNTER — Inpatient Hospital Stay (HOSPITAL_COMMUNITY): Payer: Medicare Other | Admitting: Physician Assistant

## 2018-09-27 ENCOUNTER — Inpatient Hospital Stay (HOSPITAL_COMMUNITY): Payer: Medicare Other | Admitting: Certified Registered Nurse Anesthetist

## 2018-09-27 ENCOUNTER — Observation Stay (HOSPITAL_COMMUNITY)
Admission: RE | Admit: 2018-09-27 | Discharge: 2018-09-28 | Disposition: A | Discharge status: Home / Self Care | Payer: Medicare Other | Source: Ambulatory Visit | Attending: Orthopedic Surgery | Admitting: Orthopedic Surgery

## 2018-09-27 DIAGNOSIS — J45909 Unspecified asthma, uncomplicated: Secondary | ICD-10-CM | POA: Insufficient documentation

## 2018-09-27 DIAGNOSIS — Z885 Allergy status to narcotic agent status: Secondary | ICD-10-CM | POA: Diagnosis not present

## 2018-09-27 DIAGNOSIS — Z96651 Presence of right artificial knee joint: Secondary | ICD-10-CM | POA: Diagnosis not present

## 2018-09-27 DIAGNOSIS — Z79899 Other long term (current) drug therapy: Secondary | ICD-10-CM | POA: Insufficient documentation

## 2018-09-27 DIAGNOSIS — Z7951 Long term (current) use of inhaled steroids: Secondary | ICD-10-CM | POA: Insufficient documentation

## 2018-09-27 DIAGNOSIS — Z882 Allergy status to sulfonamides status: Secondary | ICD-10-CM | POA: Insufficient documentation

## 2018-09-27 DIAGNOSIS — I739 Peripheral vascular disease, unspecified: Secondary | ICD-10-CM | POA: Diagnosis not present

## 2018-09-27 DIAGNOSIS — G8918 Other acute postprocedural pain: Secondary | ICD-10-CM | POA: Diagnosis not present

## 2018-09-27 DIAGNOSIS — Z6832 Body mass index (BMI) 32.0-32.9, adult: Secondary | ICD-10-CM | POA: Diagnosis not present

## 2018-09-27 DIAGNOSIS — E669 Obesity, unspecified: Secondary | ICD-10-CM | POA: Diagnosis not present

## 2018-09-27 DIAGNOSIS — M1712 Unilateral primary osteoarthritis, left knee: Secondary | ICD-10-CM | POA: Diagnosis not present

## 2018-09-27 DIAGNOSIS — K219 Gastro-esophageal reflux disease without esophagitis: Secondary | ICD-10-CM | POA: Insufficient documentation

## 2018-09-27 DIAGNOSIS — M1710 Unilateral primary osteoarthritis, unspecified knee: Secondary | ICD-10-CM | POA: Diagnosis present

## 2018-09-27 DIAGNOSIS — M179 Osteoarthritis of knee, unspecified: Secondary | ICD-10-CM | POA: Diagnosis present

## 2018-09-27 HISTORY — PX: TOTAL KNEE ARTHROPLASTY: SHX125

## 2018-09-27 LAB — TYPE AND SCREEN
ABO/RH(D): O NEG
Antibody Screen: NEGATIVE

## 2018-09-27 SURGERY — ARTHROPLASTY, KNEE, TOTAL
Anesthesia: Spinal | Laterality: Left

## 2018-09-27 MED ORDER — MENTHOL 3 MG MT LOZG: 1.0000 | LOZENGE | OROMUCOSAL | Status: DC | PRN

## 2018-09-27 MED ORDER — METOCLOPRAMIDE HCL 5 MG/ML IJ SOLN: 5.0000 mg | Freq: Three times a day (TID) | INTRAMUSCULAR | Status: DC | PRN

## 2018-09-27 MED ORDER — METHOCARBAMOL 500 MG IVPB - SIMPLE MED
INTRAVENOUS | Status: AC
  Administered 2018-09-27: 500 mg via INTRAVENOUS
  Filled 2018-09-27: qty 50

## 2018-09-27 MED ORDER — DEXAMETHASONE SODIUM PHOSPHATE 10 MG/ML IJ SOLN
8.0000 mg | Freq: Once | INTRAMUSCULAR | Status: AC
  Administered 2018-09-27: 8 mg via INTRAVENOUS

## 2018-09-27 MED ORDER — METOCLOPRAMIDE HCL 5 MG/ML IJ SOLN: 10.0000 mg | Freq: Once | INTRAMUSCULAR | Status: DC | PRN

## 2018-09-27 MED ORDER — PROPOFOL 10 MG/ML IV BOLUS
INTRAVENOUS | Status: AC
  Filled 2018-09-27: qty 20

## 2018-09-27 MED ORDER — SODIUM CHLORIDE (PF) 0.9 % IJ SOLN
INTRAMUSCULAR | Status: AC
  Filled 2018-09-27: qty 10

## 2018-09-27 MED ORDER — HYDROCODONE-ACETAMINOPHEN 5-325 MG PO TABS
1.0000 | ORAL_TABLET | ORAL | Status: DC | PRN
  Administered 2018-09-27: 1 via ORAL
  Administered 2018-09-27 – 2018-09-28 (×4): 2 via ORAL
  Filled 2018-09-27: qty 1
  Filled 2018-09-27 (×4): qty 2

## 2018-09-27 MED ORDER — ONDANSETRON HCL 4 MG/2ML IJ SOLN
INTRAMUSCULAR | Status: AC
  Filled 2018-09-27: qty 2

## 2018-09-27 MED ORDER — ACETAMINOPHEN 500 MG PO TABS: 1000.0000 mg | ORAL_TABLET | Freq: Four times a day (QID) | ORAL | Status: DC

## 2018-09-27 MED ORDER — FENTANYL CITRATE (PF) 100 MCG/2ML IJ SOLN
INTRAMUSCULAR | Status: DC | PRN
  Administered 2018-09-27 (×2): 50 ug via INTRAVENOUS

## 2018-09-27 MED ORDER — TRANEXAMIC ACID-NACL 1000-0.7 MG/100ML-% IV SOLN
1000.0000 mg | Freq: Once | INTRAVENOUS | Status: AC
  Administered 2018-09-27: 1000 mg via INTRAVENOUS
  Filled 2018-09-27: qty 100

## 2018-09-27 MED ORDER — LORATADINE 10 MG PO TABS
10.0000 mg | ORAL_TABLET | Freq: Every day | ORAL | Status: DC
  Administered 2018-09-28: 10 mg via ORAL
  Filled 2018-09-27: qty 1

## 2018-09-27 MED ORDER — CEFAZOLIN SODIUM-DEXTROSE 2-4 GM/100ML-% IV SOLN
2.0000 g | Freq: Four times a day (QID) | INTRAVENOUS | Status: AC
  Administered 2018-09-27 – 2018-09-28 (×2): 2 g via INTRAVENOUS
  Filled 2018-09-27 (×2): qty 100

## 2018-09-27 MED ORDER — PROPOFOL 500 MG/50ML IV EMUL
INTRAVENOUS | Status: DC | PRN
  Administered 2018-09-27: 50 ug/kg/min via INTRAVENOUS

## 2018-09-27 MED ORDER — BUPIVACAINE LIPOSOME 1.3 % IJ SUSP
INTRAMUSCULAR | Status: DC | PRN
  Administered 2018-09-27: 20 mL

## 2018-09-27 MED ORDER — PHENOL 1.4 % MT LIQD
1.0000 | OROMUCOSAL | Status: DC | PRN
  Filled 2018-09-27: qty 177

## 2018-09-27 MED ORDER — 0.9 % SODIUM CHLORIDE (POUR BTL) OPTIME
TOPICAL | Status: DC | PRN
  Administered 2018-09-27: 500 mL

## 2018-09-27 MED ORDER — FENTANYL CITRATE (PF) 100 MCG/2ML IJ SOLN
50.0000 ug | INTRAMUSCULAR | Status: DC
  Administered 2018-09-27: 50 ug via INTRAVENOUS
  Filled 2018-09-27: qty 2

## 2018-09-27 MED ORDER — GABAPENTIN 300 MG PO CAPS
300.0000 mg | ORAL_CAPSULE | Freq: Three times a day (TID) | ORAL | Status: DC
  Administered 2018-09-27 – 2018-09-28 (×4): 300 mg via ORAL
  Filled 2018-09-27 (×4): qty 1

## 2018-09-27 MED ORDER — STERILE WATER FOR IRRIGATION IR SOLN
Status: DC | PRN
  Administered 2018-09-27: 2000 mL

## 2018-09-27 MED ORDER — FENTANYL CITRATE (PF) 100 MCG/2ML IJ SOLN
INTRAMUSCULAR | Status: AC
  Administered 2018-09-27: 50 ug via INTRAVENOUS
  Filled 2018-09-27: qty 2

## 2018-09-27 MED ORDER — METOCLOPRAMIDE HCL 5 MG PO TABS: 5.0000 mg | ORAL_TABLET | Freq: Three times a day (TID) | ORAL | Status: DC | PRN

## 2018-09-27 MED ORDER — LACTATED RINGERS IV SOLN
INTRAVENOUS | Status: DC
  Administered 2018-09-27 (×2): via INTRAVENOUS

## 2018-09-27 MED ORDER — BISACODYL 10 MG RE SUPP: 10.0000 mg | Freq: Every day | RECTAL | Status: DC | PRN

## 2018-09-27 MED ORDER — ONDANSETRON HCL 4 MG/2ML IJ SOLN
INTRAMUSCULAR | Status: DC | PRN
  Administered 2018-09-27: 4 mg via INTRAVENOUS

## 2018-09-27 MED ORDER — MORPHINE SULFATE (PF) 2 MG/ML IV SOLN: 1.0000 mg | INTRAVENOUS | Status: DC | PRN

## 2018-09-27 MED ORDER — PROPOFOL 10 MG/ML IV BOLUS
INTRAVENOUS | Status: DC | PRN
  Administered 2018-09-27: 200 mg via INTRAVENOUS
  Administered 2018-09-27: 20 mg via INTRAVENOUS

## 2018-09-27 MED ORDER — CHLORHEXIDINE GLUCONATE 4 % EX LIQD: 60.0000 mL | Freq: Once | CUTANEOUS | Status: DC

## 2018-09-27 MED ORDER — SODIUM CHLORIDE 0.9 % IV SOLN
INTRAVENOUS | Status: DC
  Administered 2018-09-27: 1000 mL via INTRAVENOUS
  Administered 2018-09-28: 05:00:00 via INTRAVENOUS

## 2018-09-27 MED ORDER — SODIUM CHLORIDE (PF) 0.9 % IJ SOLN
INTRAMUSCULAR | Status: DC | PRN
  Administered 2018-09-27: 50 mL

## 2018-09-27 MED ORDER — ACETAMINOPHEN 10 MG/ML IV SOLN
1000.0000 mg | Freq: Four times a day (QID) | INTRAVENOUS | Status: DC
  Administered 2018-09-27: 1000 mg via INTRAVENOUS
  Filled 2018-09-27: qty 100

## 2018-09-27 MED ORDER — SODIUM CHLORIDE 0.9 % IR SOLN
Status: DC | PRN
  Administered 2018-09-27: 1000 mL

## 2018-09-27 MED ORDER — ONDANSETRON HCL 4 MG PO TABS: 4.0000 mg | ORAL_TABLET | Freq: Four times a day (QID) | ORAL | Status: DC | PRN

## 2018-09-27 MED ORDER — FLEET ENEMA 7-19 GM/118ML RE ENEM: 1.0000 | ENEMA | Freq: Once | RECTAL | Status: DC | PRN

## 2018-09-27 MED ORDER — DEXAMETHASONE SODIUM PHOSPHATE 10 MG/ML IJ SOLN
INTRAMUSCULAR | Status: AC
  Filled 2018-09-27: qty 1

## 2018-09-27 MED ORDER — CEFAZOLIN SODIUM-DEXTROSE 2-4 GM/100ML-% IV SOLN
2.0000 g | INTRAVENOUS | Status: AC
  Administered 2018-09-27: 2 g via INTRAVENOUS
  Filled 2018-09-27: qty 100

## 2018-09-27 MED ORDER — POLYETHYLENE GLYCOL 3350 17 G PO PACK: 17.0000 g | PACK | Freq: Every day | ORAL | Status: DC | PRN

## 2018-09-27 MED ORDER — FENTANYL CITRATE (PF) 100 MCG/2ML IJ SOLN
25.0000 ug | INTRAMUSCULAR | Status: DC | PRN
  Administered 2018-09-27: 25 ug via INTRAVENOUS
  Administered 2018-09-27: 50 ug via INTRAVENOUS
  Administered 2018-09-27: 25 ug via INTRAVENOUS

## 2018-09-27 MED ORDER — TRAMADOL HCL 50 MG PO TABS
50.0000 mg | ORAL_TABLET | Freq: Four times a day (QID) | ORAL | Status: DC | PRN
  Administered 2018-09-28 (×2): 100 mg via ORAL
  Filled 2018-09-27 (×2): qty 2

## 2018-09-27 MED ORDER — PHENYLEPHRINE 40 MCG/ML (10ML) SYRINGE FOR IV PUSH (FOR BLOOD PRESSURE SUPPORT)
PREFILLED_SYRINGE | INTRAVENOUS | Status: DC | PRN
  Administered 2018-09-27 (×2): 80 ug via INTRAVENOUS

## 2018-09-27 MED ORDER — BUPIVACAINE IN DEXTROSE 0.75-8.25 % IT SOLN
INTRATHECAL | Status: DC | PRN
  Administered 2018-09-27: 1.6 mL via INTRATHECAL

## 2018-09-27 MED ORDER — DEXAMETHASONE SODIUM PHOSPHATE 10 MG/ML IJ SOLN
10.0000 mg | Freq: Once | INTRAMUSCULAR | Status: AC
  Administered 2018-09-28: 10 mg via INTRAVENOUS
  Filled 2018-09-27: qty 1

## 2018-09-27 MED ORDER — DOCUSATE SODIUM 100 MG PO CAPS
100.0000 mg | ORAL_CAPSULE | Freq: Two times a day (BID) | ORAL | Status: DC
  Administered 2018-09-27 – 2018-09-28 (×2): 100 mg via ORAL
  Filled 2018-09-27 (×2): qty 1

## 2018-09-27 MED ORDER — EPHEDRINE 5 MG/ML INJ
INTRAVENOUS | Status: AC
  Filled 2018-09-27: qty 10

## 2018-09-27 MED ORDER — ASPIRIN EC 325 MG PO TBEC
325.0000 mg | DELAYED_RELEASE_TABLET | Freq: Two times a day (BID) | ORAL | Status: DC
  Administered 2018-09-28: 325 mg via ORAL
  Filled 2018-09-27: qty 1

## 2018-09-27 MED ORDER — DIPHENHYDRAMINE HCL 12.5 MG/5ML PO ELIX: 12.5000 mg | ORAL_SOLUTION | ORAL | Status: DC | PRN

## 2018-09-27 MED ORDER — METHOCARBAMOL 500 MG IVPB - SIMPLE MED
500.0000 mg | Freq: Four times a day (QID) | INTRAVENOUS | Status: DC | PRN
  Administered 2018-09-27: 500 mg via INTRAVENOUS
  Filled 2018-09-27: qty 50

## 2018-09-27 MED ORDER — FENTANYL CITRATE (PF) 100 MCG/2ML IJ SOLN
INTRAMUSCULAR | Status: AC
  Filled 2018-09-27: qty 2

## 2018-09-27 MED ORDER — PROPOFOL 10 MG/ML IV BOLUS
INTRAVENOUS | Status: AC
  Filled 2018-09-27: qty 40

## 2018-09-27 MED ORDER — GABAPENTIN 300 MG PO CAPS
300.0000 mg | ORAL_CAPSULE | Freq: Once | ORAL | Status: AC
  Administered 2018-09-27: 300 mg via ORAL
  Filled 2018-09-27: qty 1

## 2018-09-27 MED ORDER — TRANEXAMIC ACID-NACL 1000-0.7 MG/100ML-% IV SOLN
1000.0000 mg | INTRAVENOUS | Status: AC
  Administered 2018-09-27: 1000 mg via INTRAVENOUS
  Filled 2018-09-27: qty 100

## 2018-09-27 MED ORDER — MEPERIDINE HCL 50 MG/ML IJ SOLN: 6.2500 mg | INTRAMUSCULAR | Status: DC | PRN

## 2018-09-27 MED ORDER — METHOCARBAMOL 500 MG PO TABS
500.0000 mg | ORAL_TABLET | Freq: Four times a day (QID) | ORAL | Status: DC | PRN
  Administered 2018-09-27 – 2018-09-28 (×3): 500 mg via ORAL
  Filled 2018-09-27 (×3): qty 1

## 2018-09-27 MED ORDER — PHENYLEPHRINE 40 MCG/ML (10ML) SYRINGE FOR IV PUSH (FOR BLOOD PRESSURE SUPPORT)
PREFILLED_SYRINGE | INTRAVENOUS | Status: AC
  Filled 2018-09-27: qty 10

## 2018-09-27 MED ORDER — MIDAZOLAM HCL 2 MG/2ML IJ SOLN
1.0000 mg | INTRAMUSCULAR | Status: DC
  Administered 2018-09-27: 1 mg via INTRAVENOUS
  Filled 2018-09-27: qty 2

## 2018-09-27 MED ORDER — MIDAZOLAM HCL 2 MG/2ML IJ SOLN
INTRAMUSCULAR | Status: AC
  Filled 2018-09-27: qty 2

## 2018-09-27 MED ORDER — SODIUM CHLORIDE (PF) 0.9 % IJ SOLN
INTRAMUSCULAR | Status: AC
  Filled 2018-09-27: qty 50

## 2018-09-27 MED ORDER — BUPIVACAINE-EPINEPHRINE (PF) 0.5% -1:200000 IJ SOLN
INTRAMUSCULAR | Status: DC | PRN
  Administered 2018-09-27: 30 mL via PERINEURAL

## 2018-09-27 MED ORDER — ONDANSETRON HCL 4 MG/2ML IJ SOLN
4.0000 mg | Freq: Four times a day (QID) | INTRAMUSCULAR | Status: DC | PRN
  Administered 2018-09-27: 4 mg via INTRAVENOUS
  Filled 2018-09-27: qty 2

## 2018-09-27 MED ORDER — PANTOPRAZOLE SODIUM 40 MG PO TBEC
40.0000 mg | DELAYED_RELEASE_TABLET | Freq: Every day | ORAL | Status: DC
  Administered 2018-09-28: 40 mg via ORAL
  Filled 2018-09-27: qty 1

## 2018-09-27 MED ORDER — EPHEDRINE SULFATE-NACL 50-0.9 MG/10ML-% IV SOSY
PREFILLED_SYRINGE | INTRAVENOUS | Status: DC | PRN
  Administered 2018-09-27 (×2): 10 mg via INTRAVENOUS

## 2018-09-27 MED ORDER — LACTATED RINGERS IV SOLN: INTRAVENOUS | Status: DC

## 2018-09-27 MED ORDER — LIDOCAINE 2% (20 MG/ML) 5 ML SYRINGE
INTRAMUSCULAR | Status: AC
  Filled 2018-09-27: qty 10

## 2018-09-27 MED ORDER — MIDAZOLAM HCL 5 MG/5ML IJ SOLN
INTRAMUSCULAR | Status: DC | PRN
  Administered 2018-09-27: 1 mg via INTRAVENOUS

## 2018-09-27 MED ORDER — FLUTICASONE PROPIONATE 50 MCG/ACT NA SUSP
2.0000 | Freq: Every day | NASAL | Status: DC
  Filled 2018-09-27: qty 16

## 2018-09-27 SURGICAL SUPPLY — 61 items
ATTUNE MED DOME PAT 38 KNEE (Knees) ×2 IMPLANT
ATTUNE PSFEM LTSZ5 NARCEM KNEE (Femur) ×2 IMPLANT
ATTUNE PSRP INSE SZ5 7 KNEE (Insert) ×2 IMPLANT
BAG ZIPLOCK 12X15 (MISCELLANEOUS) ×2 IMPLANT
BANDAGE ACE 6X5 VEL STRL LF (GAUZE/BANDAGES/DRESSINGS) ×2 IMPLANT
BANDAGE ELASTIC 6 VELCRO ST LF (GAUZE/BANDAGES/DRESSINGS) ×2 IMPLANT
BASE TIBIAL ROT PLAT SZ 5 KNEE (Knees) ×1 IMPLANT
BLADE SAG 18X100X1.27 (BLADE) ×2 IMPLANT
BLADE SAW SGTL 11.0X1.19X90.0M (BLADE) IMPLANT
BLADE SAW SGTL 13.0X1.19X90.0M (BLADE) ×2 IMPLANT
BLADE SURG SZ10 CARB STEEL (BLADE) ×4 IMPLANT
BOWL SMART MIX CTS (DISPOSABLE) ×2 IMPLANT
CEMENT HV SMART SET (Cement) ×4 IMPLANT
CLSR STERI-STRIP ANTIMIC 1/2X4 (GAUZE/BANDAGES/DRESSINGS) ×2 IMPLANT
COVER SURGICAL LIGHT HANDLE (MISCELLANEOUS) ×2 IMPLANT
COVER WAND RF STERILE (DRAPES) ×2 IMPLANT
CUFF TOURN SGL QUICK 34 (TOURNIQUET CUFF) ×1
CUFF TRNQT CYL 34X4X40X1 (TOURNIQUET CUFF) ×1 IMPLANT
DECANTER SPIKE VIAL GLASS SM (MISCELLANEOUS) ×2 IMPLANT
DRAPE U-SHAPE 47X51 STRL (DRAPES) ×2 IMPLANT
DRSG ADAPTIC 3X8 NADH LF (GAUZE/BANDAGES/DRESSINGS) ×2 IMPLANT
DRSG PAD ABDOMINAL 8X10 ST (GAUZE/BANDAGES/DRESSINGS) ×2 IMPLANT
DURAPREP 26ML APPLICATOR (WOUND CARE) ×2 IMPLANT
ELECT REM PT RETURN 15FT ADLT (MISCELLANEOUS) ×2 IMPLANT
EVACUATOR 1/8 PVC DRAIN (DRAIN) ×2 IMPLANT
GAUZE SPONGE 4X4 12PLY STRL (GAUZE/BANDAGES/DRESSINGS) ×2 IMPLANT
GLOVE BIO SURGEON STRL SZ7 (GLOVE) IMPLANT
GLOVE BIO SURGEON STRL SZ8 (GLOVE) ×2 IMPLANT
GLOVE BIOGEL PI IND STRL 6.5 (GLOVE) ×1 IMPLANT
GLOVE BIOGEL PI IND STRL 7.0 (GLOVE) IMPLANT
GLOVE BIOGEL PI IND STRL 8 (GLOVE) ×1 IMPLANT
GLOVE BIOGEL PI INDICATOR 6.5 (GLOVE) ×1
GLOVE BIOGEL PI INDICATOR 7.0 (GLOVE)
GLOVE BIOGEL PI INDICATOR 8 (GLOVE) ×1
GLOVE SURG SS PI 6.5 STRL IVOR (GLOVE) ×2 IMPLANT
GOWN STRL REUS W/TWL LRG LVL3 (GOWN DISPOSABLE) ×4 IMPLANT
GOWN STRL REUS W/TWL XL LVL3 (GOWN DISPOSABLE) ×2 IMPLANT
HANDPIECE INTERPULSE COAX TIP (DISPOSABLE) ×1
HOLDER FOLEY CATH W/STRAP (MISCELLANEOUS) ×2 IMPLANT
IMMOBILIZER KNEE 20 (SOFTGOODS) ×2
IMMOBILIZER KNEE 20 THIGH 36 (SOFTGOODS) ×1 IMPLANT
MANIFOLD NEPTUNE II (INSTRUMENTS) ×2 IMPLANT
NS IRRIG 1000ML POUR BTL (IV SOLUTION) ×2 IMPLANT
PACK TOTAL KNEE CUSTOM (KITS) ×2 IMPLANT
PAD ABD 8X10 STRL (GAUZE/BANDAGES/DRESSINGS) ×2 IMPLANT
PADDING CAST COTTON 6X4 STRL (CAST SUPPLIES) ×2 IMPLANT
PIN STEINMAN FIXATION KNEE (PIN) ×2 IMPLANT
PROTECTOR NERVE ULNAR (MISCELLANEOUS) ×2 IMPLANT
SET HNDPC FAN SPRY TIP SCT (DISPOSABLE) ×1 IMPLANT
STRIP CLOSURE SKIN 1/2X4 (GAUZE/BANDAGES/DRESSINGS) ×4 IMPLANT
SUT MNCRL AB 4-0 PS2 18 (SUTURE) ×2 IMPLANT
SUT STRATAFIX 0 PDS 27 VIOLET (SUTURE) ×2
SUT VIC AB 2-0 CT1 27 (SUTURE) ×3
SUT VIC AB 2-0 CT1 TAPERPNT 27 (SUTURE) ×3 IMPLANT
SUTURE STRATFX 0 PDS 27 VIOLET (SUTURE) ×1 IMPLANT
TIBIAL BASE ROT PLAT SZ 5 KNEE (Knees) ×2 IMPLANT
TRAY FOLEY MTR SLVR 14FR STAT (SET/KITS/TRAYS/PACK) ×2 IMPLANT
TRAY FOLEY MTR SLVR 16FR STAT (SET/KITS/TRAYS/PACK) IMPLANT
WATER STERILE IRR 1000ML POUR (IV SOLUTION) ×4 IMPLANT
WRAP KNEE MAXI GEL POST OP (GAUZE/BANDAGES/DRESSINGS) ×2 IMPLANT
YANKAUER SUCT BULB TIP 10FT TU (MISCELLANEOUS) ×2 IMPLANT

## 2018-09-27 NOTE — Progress Notes (Signed)
AssistedDr. Foster with left, ultrasound guided, adductor canal block. Side rails up, monitors on throughout procedure. See vital signs in flow sheet. Tolerated Procedure well.  

## 2018-09-27 NOTE — Anesthesia Procedure Notes (Signed)
Procedure Name: MAC Date/Time: 09/27/2018 12:49 PM Performed by: West Pugh, CRNA Pre-anesthesia Checklist: Patient identified, Emergency Drugs available, Suction available, Patient being monitored and Timeout performed Patient Re-evaluated:Patient Re-evaluated prior to induction Oxygen Delivery Method: Simple face mask Preoxygenation: Pre-oxygenation with 100% oxygen Induction Type: IV induction Placement Confirmation: positive ETCO2

## 2018-09-27 NOTE — Evaluation (Signed)
Physical Therapy Evaluation Patient Details Name: Makayla Garcia MRN: [id number] DOB: 1950/11/14 Today's Date: 09/27/2018   History of Present Illness  [name], h/o R TKA  Clinical Impression  The patient tolerated  Mobilizing to recliner, felt dizzy so limited as patient going to eat dinner. Pt admitted with above diagnosis. Pt currently with functional limitations due to the deficits listed below (see PT Problem List).  Pt will benefit from skilled PT to increase their independence and safety with mobility to allow discharge to the venue listed below.       Follow Up Recommendations Outpatient PT;Follow surgeon's recommendation for DC plan and follow-up therapies    Equipment Recommendations  None recommended by PT    Recommendations for Other Services       Precautions / Restrictions Precautions Precautions: Fall;Knee Required Braces or Orthoses: Knee Immobilizer - Left Knee Immobilizer - Left: Discontinue once straight leg raise with < 10 degree lag Restrictions Weight Bearing Restrictions: No      Mobility  Bed Mobility Overal bed mobility: Needs Assistance Bed Mobility: Supine to Sit     Supine to sit: Min assist     General bed mobility comments: support left leg  Transfers Overall transfer level: Needs assistance Equipment used: Rolling walker (2 wheeled) Transfers: Sit to/from [facility] Sit to Stand: Min assist Stand pivot transfers: Min assist       General transfer comment: cues for hand and left leg, patient felt dizzy so assisted to Recliner.  Ambulation/Gait                Stairs            Wheelchair Mobility    Modified Rankin (Stroke Patients Only)       Balance                                             Pertinent Vitals/Pain Pain Assessment: 0-10 Pain Score: 2  Pain Location: left knee Pain Descriptors / Indicators: Discomfort Pain Intervention(s): Premedicated before session;Monitored  during session    Makayla Garcia expects to be discharged to:: Private residence Living Arrangements: Spouse/significant other Available Help at Discharge: Family Type of Home: House Home Access: [place]: One level Home Equipment: Environmental consultant - 2 wheels;Shower seat - built in;Cane - single point      Prior Function                 Hand Dominance        Extremity/Trunk Assessment   Upper Extremity Assessment Upper Extremity Assessment: Overall WFL for tasks assessed    Lower Extremity Assessment Lower Extremity Assessment: LLE deficits/detail LLE Deficits / Details: SLR with lag       Communication   Communication: No difficulties  Cognition Arousal/Alertness: Awake/alert Behavior During Therapy: WFL for tasks assessed/performed Overall Cognitive Status: Within Functional Limits for tasks assessed                                        General Comments      Exercises     Assessment/Plan    PT Assessment Patient needs continued PT services  PT Problem List Decreased strength;Decreased range of motion;Decreased activity tolerance;Decreased mobility;Decreased knowledge of precautions;Decreased safety awareness;Decreased knowledge of  use of DME;Pain       PT Treatment Interventions DME instruction;Therapeutic exercise;Gait training;Functional mobility training;Therapeutic activities;Patient/family education    PT Goals (Current goals can be found in the Care Plan section)  Acute Rehab PT Goals Patient Stated Goal: to go home PT Goal Formulation: With patient/family Time For Goal Achievement: 10/02/18 Potential to Achieve Goals: Good    Frequency 7X/week   Barriers to discharge        Co-evaluation               AM-PAC PT "6 Clicks" Mobility  Outcome Measure Help needed turning from your back to your side while in a flat bed without using bedrails?: A Little Help needed moving from lying on your  back to sitting on the side of a flat bed without using bedrails?: A Little Help needed moving to and from a bed to a chair (including a wheelchair)?: A Lot Help needed standing up from a chair using your arms (e.g., wheelchair or bedside chair)?: A Lot Help needed to walk in hospital room?: A Lot   6 Click Score: 12    End of Session Equipment Utilized During Treatment: Gait belt;Left knee immobilizer Activity Tolerance: Patient tolerated treatment well Patient left: in chair;with call bell/phone within reach;with family/visitor present Nurse Communication: Mobility status PT Visit Diagnosis: Unsteadiness on feet (R26.81);Pain Pain - Right/Left: Left Pain - part of body: Knee    Time: 0174-9449 PT Time Calculation (min) (ACUTE ONLY): 25 min   Charges:   PT Evaluation $PT Eval Low Complexity: 1 Low PT Treatments $Gait Training: 8-22 mins        [name] PT Acute Rehabilitation Services Pager [phone] Office [phone]   [name] 09/27/2018, 5:48 PM

## 2018-09-27 NOTE — Discharge Instructions (Signed)
° °Dr. Frank Aluisio °Total Joint Specialist °[facility] °[address] °[place], [place] [address] °[phone] ° °TOTAL KNEE REPLACEMENT POSTOPERATIVE DIRECTIONS ° °Knee Rehabilitation, Guidelines Following Surgery  °Results after knee surgery are often greatly improved when you follow the exercise, range of motion and muscle strengthening exercises prescribed by your doctor. Safety measures are also important to protect the knee from further injury. Any time any of these exercises cause you to have increased pain or swelling in your knee joint, decrease the amount until you are comfortable again and slowly increase them. If you have problems or questions, call your caregiver or physical therapist for advice.  ° °HOME CARE INSTRUCTIONS  °• Remove items at home which could result in a fall. This includes throw rugs or furniture in walking pathways.  °· ICE to the affected knee every three hours for 30 minutes at a time and then as needed for pain and swelling.  Continue to use ice on the knee for pain and swelling from surgery. You may notice swelling that will progress down to the foot and ankle.  This is normal after surgery.  Elevate the leg when you are not up walking on it.   °· Continue to use the breathing machine which will help keep your temperature down.  It is common for your temperature to cycle up and down following surgery, especially at night when you are not up moving around and exerting yourself.  The breathing machine keeps your lungs expanded and your temperature down. °· Do not place pillow under knee, focus on keeping the knee straight while resting ° °DIET °You may resume your previous home diet once your are discharged from the hospital. ° °DRESSING / WOUND CARE / SHOWERING °You may shower 3 days after surgery, but keep the wounds dry during showering.  You may use an occlusive plastic wrap (Press'n Seal for example), NO SOAKING/SUBMERGING IN THE BATHTUB.  If the bandage  gets wet, change with a clean dry gauze.  If the incision gets wet, pat the wound dry with a clean towel. °You may start showering once you are discharged home but do not submerge the incision under water. Just pat the incision dry and apply a dry gauze dressing on daily. °Change the surgical dressing daily and reapply a dry dressing each time. ° °ACTIVITY °Walk with your walker as instructed. °Use walker as long as suggested by your caregivers. °Avoid periods of inactivity such as sitting longer than an hour when not asleep. This helps prevent blood clots.  °You may resume a sexual relationship in one month or when given the OK by your doctor.  °You may return to work once you are cleared by your doctor.  °Do not drive a car for 6 weeks or until released by you surgeon.  °Do not drive while taking narcotics. ° °WEIGHT BEARING °Weight bearing as tolerated with assist device (walker, cane, etc) as directed, use it as long as suggested by your surgeon or therapist, typically at least 4-6 weeks. ° °POSTOPERATIVE CONSTIPATION PROTOCOL °Constipation - defined medically as fewer than three stools per week and severe constipation as less than one stool per week. ° °One of the most common issues patients have following surgery is constipation.  Even if you have a regular bowel pattern at home, your normal regimen is likely to be disrupted due to multiple reasons following surgery.  Combination of anesthesia, postoperative narcotics, change in appetite and fluid intake all can affect your bowels.    In order to avoid complications following surgery, here are some recommendations in order to help you during your recovery period. ° °Colace (docusate) - Pick up an over-the-counter form of Colace or another stool softener and take twice a day as long as you are requiring postoperative pain medications.  Take with a full glass of water daily.  If you experience loose stools or diarrhea, hold the colace until you stool forms back  up.  If your symptoms do not get better within 1 week or if they get worse, check with your doctor. ° °Dulcolax (bisacodyl) - Pick up over-the-counter and take as directed by the product packaging as needed to assist with the movement of your bowels.  Take with a full glass of water.  Use this product as needed if not relieved by Colace only.  ° °MiraLax (polyethylene glycol) - Pick up over-the-counter to have on hand.  MiraLax is a solution that will increase the amount of water in your bowels to assist with bowel movements.  Take as directed and can mix with a glass of water, juice, soda, coffee, or tea.  Take if you go more than two days without a movement. °Do not use MiraLax more than once per day. Call your doctor if you are still constipated or irregular after using this medication for 7 days in a row. ° °If you continue to have problems with postoperative constipation, please contact the office for further assistance and recommendations.  If you experience "the worst abdominal pain ever" or develop nausea or vomiting, please contact the office immediatly for further recommendations for treatment. ° °ITCHING ° If you experience itching with your medications, try taking only a single pain pill, or even half a pain pill at a time.  You can also use Benadryl over the counter for itching or also to help with sleep.  ° °TED HOSE STOCKINGS °Wear the elastic stockings on both legs for three weeks following surgery during the day but you may remove then at night for sleeping. ° °MEDICATIONS °See your medication summary on the “After Visit Summary” that the nursing staff will review with you prior to discharge.  You may have some home medications which will be placed on hold until you complete the course of blood thinner medication.  It is important for you to complete the blood thinner medication as prescribed by your surgeon.  Continue your approved medications as instructed at time of discharge. ° °PRECAUTIONS °If  you experience chest pain or shortness of breath - call 911 immediately for transfer to the hospital emergency department.  °If you develop a fever greater that 101 F, purulent drainage from wound, increased redness or drainage from wound, foul odor from the wound/dressing, or calf pain - CONTACT YOUR SURGEON.   °                                                °FOLLOW-UP APPOINTMENTS °Make sure you keep all of your appointments after your operation with your surgeon and caregivers. You should call the office at the above phone number and make an appointment for approximately two weeks after the date of your surgery or on the date instructed by your surgeon outlined in the "After Visit Summary". ° ° °RANGE OF MOTION AND STRENGTHENING EXERCISES  °Rehabilitation of the knee is important following a knee injury or   an operation. After just a few days of immobilization, the muscles of the thigh which control the knee become weakened and shrink (atrophy). Knee exercises are designed to build up the tone and strength of the thigh muscles and to improve knee motion. Often times heat used for twenty to thirty minutes before working out will loosen up your tissues and help with improving the range of motion but do not use heat for the first two weeks following surgery. These exercises can be done on a training (exercise) mat, on the floor, on a table or on a bed. Use what ever works the best and is most comfortable for you Knee exercises include:  °• Leg Lifts - While your knee is still immobilized in a splint or cast, you can do straight leg raises. Lift the leg to 60 degrees, hold for 3 sec, and slowly lower the leg. Repeat 10-20 times 2-3 times daily. Perform this exercise against resistance later as your knee gets better.  °• Quad and Hamstring Sets - Tighten up the muscle on the front of the thigh (Quad) and hold for 5-10 sec. Repeat this 10-20 times hourly. Hamstring sets are done by pushing the foot backward against an  object and holding for 5-10 sec. Repeat as with quad sets.  °· Leg Slides: Lying on your back, slowly slide your foot toward your buttocks, bending your knee up off the floor (only go as far as is comfortable). Then slowly slide your foot back down until your leg is flat on the floor again. °· Angel Wings: Lying on your back spread your legs to the side as far apart as you can without causing discomfort.  °A rehabilitation program following serious knee injuries can speed recovery and prevent re-injury in the future due to weakened muscles. Contact your doctor or a physical therapist for more information on knee rehabilitation.  ° °IF YOU ARE TRANSFERRED TO A SKILLED REHAB FACILITY °If the patient is transferred to a skilled rehab facility following release from the hospital, a list of the current medications will be sent to the facility for the patient to continue.  When discharged from the skilled rehab facility, please have the facility set up the patient's Home Health Physical Therapy prior to being released. Also, the skilled facility will be responsible for providing the patient with their medications at time of release from the facility to include their pain medication, the muscle relaxants, and their blood thinner medication. If the patient is still at the rehab facility at time of the two week follow up appointment, the skilled rehab facility will also need to assist the patient in arranging follow up appointment in our office and any transportation needs. ° °MAKE SURE YOU:  °• Understand these instructions.  °• Get help right away if you are not doing well or get worse.  ° ° °Pick up stool softner and laxative for home use following surgery while on pain medications. °Do not submerge incision under water. °Please use good hand washing techniques while changing dressing each day. °May shower starting three days after surgery. °Please use a clean towel to pat the incision dry following showers. °Continue to  use ice for pain and swelling after surgery. °Do not use any lotions or creams on the incision until instructed by your surgeon. ° °

## 2018-09-27 NOTE — Op Note (Signed)
OPERATIVE REPORT-TOTAL KNEE ARTHROPLASTY   Pre-operative diagnosis- Osteoarthritis  Left knee(s)  Post-operative diagnosis- Osteoarthritis Left knee(s)  Procedure-  Left  Total Knee Arthroplasty (Depuy Attune)  Surgeon- Dione Plover. Hadassa Cermak, MD  Assistant- Ardeen Jourdain, PA-C   Anesthesia-  GA combined with regional for post-op pain  EBL-25 mL   Drains Hemovac  Tourniquet time-  Total Tourniquet Time Documented: Thigh (Left) - 35 minutes Total: Thigh (Left) - 35 minutes     Complications- None  Condition-PACU - hemodynamically stable.   Brief Clinical Note  Makayla Garcia is a 68 y.o. year old female with end stage OA of her left knee with progressively worsening pain and dysfunction. She has constant pain, with activity and at rest and significant functional deficits with difficulties even with ADLs. She has had extensive non-op management including analgesics, injections of cortisone and viscosupplements, and home exercise program, but remains in significant pain with significant dysfunction. Radiographs show bone on bone arthritis medial and patellofemoral. She presents now for left Total Knee Arthroplasty.    Procedure in detail---   The patient is brought into the operating room and positioned supine on the operating table. After successful administration of  GA combined with regional for post-op pain,   a tourniquet is placed high on the  Left thigh(s) and the lower extremity is prepped and draped in the usual sterile fashion. Time out is performed by the operating team and then the  Left lower extremity is wrapped in Esmarch, knee flexed and the tourniquet inflated to 300 mmHg.       A midline incision is made with a ten blade through the subcutaneous tissue to the level of the extensor mechanism. A fresh blade is used to make a medial parapatellar arthrotomy. Soft tissue over the proximal medial tibia is subperiosteally elevated to the joint line with a knife and into the  semimembranosus bursa with a Cobb elevator. Soft tissue over the proximal lateral tibia is elevated with attention being paid to avoiding the patellar tendon on the tibial tubercle. The patella is everted, knee flexed 90 degrees and the ACL and PCL are removed. Findings are bone on bone medial and patellofemoral with large global osteophytes.        The drill is used to create a starting hole in the distal femur and the canal is thoroughly irrigated with sterile saline to remove the fatty contents. The 5 degree Left  valgus alignment guide is placed into the femoral canal and the distal femoral cutting block is pinned to remove 9 mm off the distal femur. Resection is made with an oscillating saw.      The tibia is subluxed forward and the menisci are removed. The extramedullary alignment guide is placed referencing proximally at the medial aspect of the tibial tubercle and distally along the second metatarsal axis and tibial crest. The block is pinned to remove 62mm off the more deficient medial  side. Resection is made with an oscillating saw. Size 5is the most appropriate size for the tibia and the proximal tibia is prepared with the modular drill and keel punch for that size.      The femoral sizing guide is placed and size 5 is most appropriate. Rotation is marked off the epicondylar axis and confirmed by creating a rectangular flexion gap at 90 degrees. The size 5 cutting block is pinned in this rotation and the anterior, posterior and chamfer cuts are made with the oscillating saw. The intercondylar block is then placed and  that cut is made.      Trial size 5 tibial component, trial size 5 narrow posterior stabilized femur and a 7  mm posterior stabilized rotating platform insert trial is placed. Full extension is achieved with excellent varus/valgus and anterior/posterior balance throughout full range of motion. The patella is everted and thickness measured to be 22  mm. Free hand resection is taken to 12  mm, a 38 template is placed, lug holes are drilled, trial patella is placed, and it tracks normally. Osteophytes are removed off the posterior femur with the trial in place. All trials are removed and the cut bone surfaces prepared with pulsatile lavage. Cement is mixed and once ready for implantation, the size 5 tibial implant, size  5 narrow posterior stabilized femoral component, and the size 35 patella are cemented in place and the patella is held with the clamp. The trial insert is placed and the knee held in full extension. The Exparel (20 ml mixed with 60 ml saline) is injected into the extensor mechanism, posterior capsule, medial and lateral gutters and subcutaneous tissues.  All extruded cement is removed and once the cement is hard the permanent 7 mm posterior stabilized rotating platform insert is placed into the tibial tray.      The wound is copiously irrigated with saline solution and the extensor mechanism closed over a hemovac drain with #1 V-loc suture. The tourniquet is released for a total tourniquet time of 35  minutes. Flexion against gravity is 140 degrees and the patella tracks normally. Subcutaneous tissue is closed with 2.0 vicryl and subcuticular with running 4.0 Monocryl. The incision is cleaned and dried and steri-strips and a bulky sterile dressing are applied. The limb is placed into a knee immobilizer and the patient is awakened and transported to recovery in stable condition.      Please note that a surgical assistant was a medical necessity for this procedure in order to perform it in a safe and expeditious manner. Surgical assistant was necessary to retract the ligaments and vital neurovascular structures to prevent injury to them and also necessary for proper positioning of the limb to allow for anatomic placement of the prosthesis.   Dione Plover Anselmo Reihl, MD    09/27/2018, 1:57 PM

## 2018-09-27 NOTE — Anesthesia Procedure Notes (Signed)
Procedure Name: LMA Insertion Date/Time: 09/27/2018 1:28 PM Performed by: West Pugh, CRNA Pre-anesthesia Checklist: Patient identified, Emergency Drugs available, Suction available, Patient being monitored and Timeout performed Patient Re-evaluated:Patient Re-evaluated prior to induction Oxygen Delivery Method: Circle system utilized Preoxygenation: Pre-oxygenation with 100% oxygen Induction Type: IV induction Ventilation: Mask ventilation without difficulty LMA: LMA with gastric port inserted LMA Size: 4.0 Number of attempts: 1 Tube secured with: Tape Dental Injury: Teeth and Oropharynx as per pre-operative assessment

## 2018-09-27 NOTE — Care Plan (Signed)
Ortho Bundle Case Management Note  Patient Details  Name: Makayla Garcia MRN: [id number] Date of Birth: [birth year]  [name] scheduled on 09-27-2018 DCP:  Home with spouse.  1 story home with 14 ste.  Has elevator in garage.   DME:  No needs. Has a RW and 3-in-1. PT:  [facility].  PT eval scheduled on 10-01-2018.                   DME Arranged:  N/A DME Agency:  NA  HH Arranged:  NA HH Agency:  NA  Additional Comments: Please contact me with any questions of if this plan should need to change.  Marianne Sofia, RN,CCM [facility]  [phone] 09/27/2018, 10:06 AM

## 2018-09-27 NOTE — Transfer of Care (Signed)
Immediate Anesthesia Transfer of Care Note  Patient: Makayla Garcia  Procedure(s) Performed: TOTAL KNEE ARTHROPLASTY (Left )  Patient Location: PACU  Anesthesia Type:GA combined with regional for post-op pain  Level of Consciousness: awake, alert , oriented and patient cooperative  Airway & Oxygen Therapy: Patient Spontanous Breathing and Patient connected to face mask oxygen  Post-op Assessment: Report given to RN and Post -op Vital signs reviewed and stable  Post vital signs: Reviewed and stable  Last Vitals:  Vitals Value Taken Time  BP 121/69 09/27/2018  2:19 PM  Temp    Pulse 84 09/27/2018  2:21 PM  Resp 15 09/27/2018  2:21 PM  SpO2 100 % 09/27/2018  2:21 PM  Vitals shown include unvalidated device data.  Last Pain:  Vitals:   09/27/18 1222  TempSrc:   PainSc: 0-No pain         Complications: No apparent anesthesia complications

## 2018-09-27 NOTE — Anesthesia Procedure Notes (Signed)
Anesthesia Regional Block: Adductor canal block   Pre-Anesthetic Checklist: ,, timeout performed, Correct Patient, Correct Site, Correct Laterality, Correct Procedure, Correct Position, site marked, Risks and benefits discussed,  Surgical consent,  Pre-op evaluation,  At surgeon's request and post-op pain management  Laterality: Left  Prep: chloraprep       Needles:  Injection technique: Single-shot  Needle Type: Echogenic Stimulator Needle     Needle Length: 9cm  Needle Gauge: 21   Needle insertion depth: 7 cm   Additional Needles:   Narrative:  Start time: 09/27/2018 12:18 PM End time: 09/27/2018 12:22 PM Injection made incrementally with aspirations every 5 mL.  Performed by: Personally  Anesthesiologist: Josephine Igo, MD  Additional Notes: Timeout performed. Patient sedated. Relevant anatomy ID'd using [place]. Incremental 2-65ml injection of LA with frequent aspiration. Patient tolerated procedure well.        Left Adductor Canal Block

## 2018-09-27 NOTE — Anesthesia Postprocedure Evaluation (Signed)
Anesthesia Post Note  Patient: Berline Stutsman  Procedure(s) Performed: TOTAL KNEE ARTHROPLASTY (Left )     Patient location during evaluation: PACU Anesthesia Type: Spinal and General Level of consciousness: awake and alert and oriented Pain management: pain level controlled Vital Signs Assessment: post-procedure vital signs reviewed and stable Respiratory status: spontaneous breathing, nonlabored ventilation, respiratory function stable and patient connected to nasal cannula oxygen Cardiovascular status: blood pressure returned to baseline and stable Postop Assessment: no apparent nausea or vomiting, spinal receding, patient able to bend at knees, no headache and no backache Anesthetic complications: no    Last Vitals:  Vitals:   09/27/18 1445 09/27/18 1500  BP: 126/72 128/76  Pulse: (!) 59 79  Resp: 12 16  Temp:    SpO2: 100% 99%    Last Pain:  Vitals:   09/27/18 1500  TempSrc:   PainSc: 4                  Veola Cafaro A.

## 2018-09-27 NOTE — Interval H&P Note (Signed)
History and Physical Interval Note:  09/27/2018 10:38 AM  Makayla Garcia  has presented today for surgery, with the diagnosis of left knee osteoarthritis  The various methods of treatment have been discussed with the patient and family. After consideration of risks, benefits and other options for treatment, the patient has consented to  Procedure(s) with comments: TOTAL KNEE ARTHROPLASTY (Left) - 39min as a surgical intervention .  The patient's history has been reviewed, patient examined, no change in status, stable for surgery.  I have reviewed the patient's chart and labs.  Questions were answered to the patient's satisfaction.     Pilar Plate Elmond Poehlman

## 2018-09-27 NOTE — Anesthesia Procedure Notes (Addendum)
Spinal  Patient location during procedure: OR Start time: 09/27/2018 12:49 PM End time: 09/27/2018 12:58 PM Reason for block: at surgeon's request Staffing Resident/CRNA: West Pugh, CRNA Performed: resident/CRNA  Preanesthetic Checklist Completed: patient identified, site marked, surgical consent, pre-op evaluation, timeout performed, IV checked, risks and benefits discussed and monitors and equipment checked Spinal Block Patient position: sitting Prep: DuraPrep Patient monitoring: heart rate, continuous pulse ox and blood pressure Approach: midline Location: L3-4 Injection technique: single-shot Needle Needle type: Pencan  Needle gauge: 24 G Needle length: 9 cm Assessment Sensory level: T8 Additional Notes Expiration of kit checked and confirmed. Patient tolerated procedure well,without complications with noted clear CSF. Loss of motor and sensory on exam post injection. Dr Royce Macadamia at bedside for procedure.

## 2018-09-28 ENCOUNTER — Encounter (HOSPITAL_COMMUNITY): Payer: Self-pay | Admitting: Orthopedic Surgery

## 2018-09-28 DIAGNOSIS — Z7951 Long term (current) use of inhaled steroids: Secondary | ICD-10-CM | POA: Diagnosis not present

## 2018-09-28 DIAGNOSIS — J45909 Unspecified asthma, uncomplicated: Secondary | ICD-10-CM | POA: Diagnosis not present

## 2018-09-28 DIAGNOSIS — M1712 Unilateral primary osteoarthritis, left knee: Secondary | ICD-10-CM | POA: Diagnosis not present

## 2018-09-28 DIAGNOSIS — K219 Gastro-esophageal reflux disease without esophagitis: Secondary | ICD-10-CM | POA: Diagnosis not present

## 2018-09-28 DIAGNOSIS — Z79899 Other long term (current) drug therapy: Secondary | ICD-10-CM | POA: Diagnosis not present

## 2018-09-28 DIAGNOSIS — I739 Peripheral vascular disease, unspecified: Secondary | ICD-10-CM | POA: Diagnosis not present

## 2018-09-28 LAB — CBC
HCT: 37.9 % (ref 36.0–46.0)
Hemoglobin: 11.9 g/dL — ABNORMAL LOW (ref 12.0–15.0)
MCH: 29.6 pg (ref 26.0–34.0)
MCHC: 31.4 g/dL (ref 30.0–36.0)
MCV: 94.3 fL (ref 80.0–100.0)
Platelets: 193 10*3/uL (ref 150–400)
RBC: 4.02 MIL/uL (ref 3.87–5.11)
RDW: 13.1 % (ref 11.5–15.5)
WBC: 12.5 10*3/uL — ABNORMAL HIGH (ref 4.0–10.5)
nRBC: 0 % (ref 0.0–0.2)

## 2018-09-28 LAB — BASIC METABOLIC PANEL
Anion gap: 7 (ref 5–15)
BUN: 13 mg/dL (ref 8–23)
CO2: 25 mmol/L (ref 22–32)
Calcium: 8.6 mg/dL — ABNORMAL LOW (ref 8.9–10.3)
Chloride: 108 mmol/L (ref 98–111)
Creatinine, Ser: 0.68 mg/dL (ref 0.44–1.00)
GFR calc Af Amer: >60 mL/min (ref >60)
GFR calc non Af Amer: >60 mL/min (ref >60)
Glucose, Bld: 127 mg/dL — ABNORMAL HIGH (ref 70–99)
Potassium: 4.7 mmol/L (ref 3.5–5.1)
Sodium: 140 mmol/L (ref 135–145)

## 2018-09-28 MED ORDER — METHOCARBAMOL 500 MG PO TABS: 500.0000 mg | ORAL_TABLET | Freq: Four times a day (QID) | ORAL | 0 refills | Status: DC | PRN

## 2018-09-28 MED ORDER — GABAPENTIN 300 MG PO CAPS: 300.0000 mg | ORAL_CAPSULE | Freq: Three times a day (TID) | ORAL | 0 refills | Status: DC

## 2018-09-28 MED ORDER — ASPIRIN 325 MG PO TBEC: 325.0000 mg | DELAYED_RELEASE_TABLET | Freq: Two times a day (BID) | ORAL | 0 refills | Status: AC

## 2018-09-28 MED ORDER — HYDROCODONE-ACETAMINOPHEN 5-325 MG PO TABS: 1.0000 | ORAL_TABLET | Freq: Four times a day (QID) | ORAL | 0 refills | Status: DC | PRN

## 2018-09-28 MED ORDER — TRAMADOL HCL 50 MG PO TABS: 50.0000 mg | ORAL_TABLET | Freq: Four times a day (QID) | ORAL | 0 refills | Status: DC | PRN
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
## 2018-09-28 NOTE — Care Management Obs Status (Deleted)
[facility] NOTIFICATION   Patient Details  Name: Makayla Garcia MRN: [id number] Date of Birth: 12/07/1950   Medicare Observation Status Notification Given:  Yes    Guadalupe Maple, RN 09/28/2018, 2:09 PM

## 2018-09-28 NOTE — Care Management Note (Signed)
Spoke with patient at bedside. Confirmed plan for OP PT, already arranged. Has RW and 3n1. [phone] Management Note  Patient Details  Name: Makayla Garcia MRN: [id number] Date of Birth: June 19, 1951  Subjective/Objective:   Spoke with patient at bedside. Confirmed plan for OP PT, already arranged. Has RW and 3n1. [phone]                 Action/Plan:   Expected Discharge Date:  09/28/18               Expected Discharge Plan:  OP Rehab  In-House Referral:  NA  Discharge planning Services  CM Consult  Post Acute Care Choice:  NA Choice offered to:  Patient  DME Arranged:  N/A DME Agency:  NA  HH Arranged:  NA HH Agency:  NA  Status of Service:  Completed, signed off  If discussed at [place] of Stay Meetings, dates discussed:    Additional Comments:  Guadalupe Maple, RN 09/28/2018, 11:14 AM

## 2018-09-28 NOTE — Care Management Obs Status (Signed)
[facility] NOTIFICATION   Patient Details  Name: Oluwatobi Ruppe MRN: [id number] Date of Birth: 04-17-1951   Medicare Observation Status Notification Given:  Yes    Guadalupe Maple, RN 09/28/2018, 2:10 PM

## 2018-09-28 NOTE — Progress Notes (Signed)
Physical Therapy Treatment Patient Details Name: Makayla Garcia MRN: [id number] DOB: 1951/04/08 Today's Date: 09/28/2018    History of Present Illness Makayla Garcia, h/o R Garcia    PT Comments    Patient is improving in mobility . Plans DC today if pain is controlled  Follow Up Recommendations  Outpatient PT;Follow surgeon's recommendation for DC plan and follow-up therapies     Equipment Recommendations  None recommended by PT    Recommendations for Other Services       Precautions / Restrictions Precautions Precautions: Fall;Knee Required Braces or Orthoses: Knee Immobilizer - Left Knee Immobilizer - Left: Discontinue once straight leg raise with < 10 degree lag    Mobility  Bed Mobility               General bed mobility comments: oob  Transfers   Equipment used: Rolling walker (2 wheeled) Transfers: Sit to/from Stand Sit to Stand: Min guard         General transfer comment: cues for hand and left leg,   Ambulation/Gait Ambulation/Gait assistance: Min guard Gait Distance (Feet): 200 Feet Assistive device: Rolling walker (2 wheeled) Gait Pattern/deviations: Step-through pattern     General Gait Details: gait is smoothe, not antalgic   Stairs             Wheelchair Mobility    Modified Rankin (Stroke Patients Only)       Balance                                            Cognition Arousal/Alertness: Awake/alert                                            Exercises Total Joint Exercises Ankle Circles/Pumps: AROM;Both;10 reps Quad Sets: AROM;Both;10 reps Heel Slides: AAROM;Supine;10 reps;Left Hip ABduction/ADduction: AAROM;Supine;10 reps;Left Straight Leg Raises: AAROM;Supine;10 reps;Left Goniometric ROM: 10-50 left klnee flex    General Comments        Pertinent Vitals/Pain Pain Score: 3  Pain Location: left knee Pain Descriptors / Indicators: Discomfort Pain Intervention(s): Monitored during  session;Premedicated before session;Ice applied    Home Living                      Prior Function            PT Goals (current goals can now be found in the care plan section) Progress towards PT goals: Progressing toward goals    Frequency    7X/week      PT Plan Current plan remains appropriate    Co-evaluation              AM-PAC PT "6 Clicks" Mobility   Outcome Measure  Help needed turning from your back to your side while in a flat bed without using bedrails?: A Little Help needed moving from lying on your back to sitting on the side of a flat bed without using bedrails?: A Little Help needed moving to and from a bed to a chair (including a wheelchair)?: A Little Help needed standing up from a chair using your arms (e.g., wheelchair or bedside chair)?: A Little Help needed to walk in hospital room?: A Little Help needed climbing 3-5 steps with a railing? : A Lot 6 Click  Score: 17    End of Session Equipment Utilized During Treatment: Gait belt;Left knee immobilizer Activity Tolerance: Patient tolerated treatment well Patient left: in chair;with call bell/phone within reach;with family/visitor present Nurse Communication: Mobility status PT Visit Diagnosis: Unsteadiness on feet (R26.81);Pain Pain - Right/Left: Left Pain - part of body: Knee     Time: 1026-1050 PT Time Calculation (min) (ACUTE ONLY): 24 min  Charges:  $Gait Training: 8-22 mins $Therapeutic Exercise: 8-22 mins                     [name] PT Acute Rehabilitation Services Pager [phone] Office [phone]    [name] 09/28/2018, 12:55 PM

## 2018-09-28 NOTE — Care Management CC44 (Signed)
Condition Code 44 Documentation Completed  Patient Details  Name: Thresia Ramanathan MRN: [id number] Date of Birth: 02-May-1951   Condition Code 44 given:  Yes Patient signature on Condition Code 44 notice:  Yes Documentation of 2 MD's agreement:  Yes Code 44 added to claim:  Yes    Guadalupe Maple, RN 09/28/2018, 2:10 PM

## 2018-09-28 NOTE — Progress Notes (Signed)
Physical Therapy Treatment Patient Details Name: Makayla Garcia MRN: [id number] DOB: June 25, 1951 Today's Date: 09/28/2018    History of Present Illness [name], h/o R TKA    PT Comments    The patient is ambulating well without KI. Ready for Dc.  Follow Up Recommendations  Outpatient PT;Follow surgeon's recommendation for DC plan and follow-up therapies     Equipment Recommendations  None recommended by PT    Recommendations for Other Services       Precautions / Restrictions Precautions Precautions: Fall;Knee Precaution Comments: does not require KI Required Braces or Orthoses: Knee Immobilizer - Left Knee Immobilizer - Left: Discontinue once straight leg raise with < 10 degree lag    Mobility  Bed Mobility               General bed mobility comments: oob  Transfers   Equipment used: Rolling walker (2 wheeled) Transfers: Sit to/from Stand Sit to Stand: Supervision         General transfer comment: cues for hand and left leg,   Ambulation/Gait Ambulation/Gait assistance: Supervision Gait Distance (Feet): 200 Feet Assistive device: Rolling walker (2 wheeled) Gait Pattern/deviations: Step-through pattern     General Gait Details: gait is smoothe, not antalgic, no KI   Marine scientist Rankin (Stroke Patients Only)       Balance                                            Cognition Arousal/Alertness: Awake/alert                                            Exercises Total Joint Exercises Ankle Circles/Pumps: AROM;Both;10 reps Quad Sets: AROM;Both;10 reps Heel Slides: AAROM;Supine;10 reps;Left Hip ABduction/ADduction: AAROM;Supine;10 reps;Left Straight Leg Raises: AAROM;Supine;10 reps;Left Goniometric ROM: 10-50 left klnee flex    General Comments        Pertinent Vitals/Pain Pain Score: 2  Pain Location: left knee Pain Descriptors / Indicators: Discomfort Pain  Intervention(s): Monitored during session;Premedicated before session    Home Living                      Prior Function            PT Goals (current goals can now be found in the care plan section) Progress towards PT goals: Progressing toward goals    Frequency    7X/week      PT Plan Current plan remains appropriate    Co-evaluation              AM-PAC PT "6 Clicks" Mobility   Outcome Measure  Help needed turning from your back to your side while in a flat bed without using bedrails?: A Little Help needed moving from lying on your back to sitting on the side of a flat bed without using bedrails?: A Little Help needed moving to and from a bed to a chair (including a wheelchair)?: A Little Help needed standing up from a chair using your arms (e.g., wheelchair or bedside chair)?: A Little Help needed to walk in hospital room?: A Little Help needed climbing 3-5 steps with a railing? : A Lot  6 Click Score: 14    End of Session Equipment Utilized During Treatment: Gait belt;Left knee immobilizer Activity Tolerance: Patient tolerated treatment well Patient left: in chair;with call bell/phone within reach;with family/visitor present Nurse Communication: Mobility status PT Visit Diagnosis: Unsteadiness on feet (R26.81);Pain Pain - Right/Left: Left Pain - part of body: Knee     Time: 5258-9483 PT Time Calculation (min) (ACUTE ONLY): 17 min  Charges:  $Gait Training: 8-22 mins $T                   [name] PT Acute Rehabilitation Services Pager [phone] Office [phone]    [name] 09/28/2018, 3:20 PM

## 2018-09-28 NOTE — Plan of Care (Signed)
  Problem: Education: Goal: Knowledge of the prescribed therapeutic regimen will improve Outcome: Progressing   Problem: Pain Management: Goal: Pain level will decrease with appropriate interventions Outcome: Progressing   Problem: Education: Goal: Knowledge of General Education information will improve Description Including pain rating scale, medication(s)/side effects and non-pharmacologic comfort measures Outcome: Progressing   Problem: Activity: Goal: Risk for activity intolerance will decrease Outcome: Progressing   Problem: Nutrition: Goal: Adequate nutrition will be maintained Outcome: Progressing   Problem: Pain Managment: Goal: General experience of comfort will improve Outcome: Progressing   Problem: Safety: Goal: Ability to remain free from injury will improve Outcome: Progressing
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
## 2018-10-01 DIAGNOSIS — M25562 Pain in left knee: Secondary | ICD-10-CM | POA: Diagnosis not present

## 2018-10-01 DIAGNOSIS — Z96652 Presence of left artificial knee joint: Secondary | ICD-10-CM | POA: Diagnosis not present

## 2018-10-04 DIAGNOSIS — M25562 Pain in left knee: Secondary | ICD-10-CM | POA: Diagnosis not present

## 2018-10-04 DIAGNOSIS — Z96652 Presence of left artificial knee joint: Secondary | ICD-10-CM | POA: Diagnosis not present

## 2018-10-08 DIAGNOSIS — Z96652 Presence of left artificial knee joint: Secondary | ICD-10-CM | POA: Diagnosis not present

## 2018-10-08 DIAGNOSIS — M25562 Pain in left knee: Secondary | ICD-10-CM | POA: Diagnosis not present

## 2018-10-11 DIAGNOSIS — M25562 Pain in left knee: Secondary | ICD-10-CM | POA: Diagnosis not present

## 2018-10-11 DIAGNOSIS — Z96652 Presence of left artificial knee joint: Secondary | ICD-10-CM | POA: Diagnosis not present

## 2018-10-18 DIAGNOSIS — Z96652 Presence of left artificial knee joint: Secondary | ICD-10-CM | POA: Diagnosis not present

## 2018-10-18 DIAGNOSIS — M25562 Pain in left knee: Secondary | ICD-10-CM | POA: Diagnosis not present

## 2018-10-22 DIAGNOSIS — M25562 Pain in left knee: Secondary | ICD-10-CM | POA: Diagnosis not present

## 2018-10-22 DIAGNOSIS — Z96652 Presence of left artificial knee joint: Secondary | ICD-10-CM | POA: Diagnosis not present

## 2018-10-25 DIAGNOSIS — Z96652 Presence of left artificial knee joint: Secondary | ICD-10-CM | POA: Diagnosis not present

## 2018-10-25 DIAGNOSIS — M25562 Pain in left knee: Secondary | ICD-10-CM | POA: Diagnosis not present

## 2018-10-29 DIAGNOSIS — Z96652 Presence of left artificial knee joint: Secondary | ICD-10-CM | POA: Diagnosis not present

## 2018-10-29 DIAGNOSIS — M25562 Pain in left knee: Secondary | ICD-10-CM | POA: Diagnosis not present

## 2018-11-01 DIAGNOSIS — M25562 Pain in left knee: Secondary | ICD-10-CM | POA: Diagnosis not present

## 2018-11-01 DIAGNOSIS — Z96652 Presence of left artificial knee joint: Secondary | ICD-10-CM | POA: Diagnosis not present

## 2018-11-02 DIAGNOSIS — Z471 Aftercare following joint replacement surgery: Secondary | ICD-10-CM | POA: Diagnosis not present

## 2018-11-02 DIAGNOSIS — Z96652 Presence of left artificial knee joint: Secondary | ICD-10-CM | POA: Diagnosis not present

## 2018-11-05 DIAGNOSIS — Z96652 Presence of left artificial knee joint: Secondary | ICD-10-CM | POA: Diagnosis not present

## 2018-11-05 DIAGNOSIS — M25562 Pain in left knee: Secondary | ICD-10-CM | POA: Diagnosis not present

## 2018-11-08 DIAGNOSIS — M25562 Pain in left knee: Secondary | ICD-10-CM | POA: Diagnosis not present

## 2018-11-08 DIAGNOSIS — Z96652 Presence of left artificial knee joint: Secondary | ICD-10-CM | POA: Diagnosis not present

## 2018-11-12 DIAGNOSIS — M25562 Pain in left knee: Secondary | ICD-10-CM | POA: Diagnosis not present

## 2018-11-12 DIAGNOSIS — Z96652 Presence of left artificial knee joint: Secondary | ICD-10-CM | POA: Diagnosis not present

## 2018-11-15 DIAGNOSIS — M25562 Pain in left knee: Secondary | ICD-10-CM | POA: Diagnosis not present

## 2018-11-15 DIAGNOSIS — Z96652 Presence of left artificial knee joint: Secondary | ICD-10-CM | POA: Diagnosis not present

## 2018-12-13 DIAGNOSIS — Z96652 Presence of left artificial knee joint: Secondary | ICD-10-CM | POA: Insufficient documentation

## 2018-12-13 DIAGNOSIS — Z5189 Encounter for other specified aftercare: Secondary | ICD-10-CM | POA: Insufficient documentation

## 2019-02-08 ENCOUNTER — Ambulatory Visit: Payer: Medicare Other

## 2019-03-21 DIAGNOSIS — M810 Age-related osteoporosis without current pathological fracture: Secondary | ICD-10-CM | POA: Diagnosis not present

## 2019-03-21 DIAGNOSIS — L309 Dermatitis, unspecified: Secondary | ICD-10-CM | POA: Diagnosis not present

## 2019-03-21 DIAGNOSIS — E2839 Other primary ovarian failure: Secondary | ICD-10-CM | POA: Diagnosis not present

## 2019-03-24 ENCOUNTER — Other Ambulatory Visit: Payer: Self-pay | Admitting: Family Medicine

## 2019-03-24 DIAGNOSIS — E2839 Other primary ovarian failure: Secondary | ICD-10-CM

## 2019-03-31 ENCOUNTER — Ambulatory Visit
Admission: RE | Admit: 2019-03-31 | Discharge: 2019-03-31 | Disposition: A | Discharge status: Home / Self Care | Payer: Medicare Other | Source: Ambulatory Visit | Attending: Family Medicine | Admitting: Family Medicine

## 2019-03-31 ENCOUNTER — Other Ambulatory Visit: Payer: Self-pay

## 2019-03-31 DIAGNOSIS — Z1231 Encounter for screening mammogram for malignant neoplasm of breast: Secondary | ICD-10-CM

## 2019-05-02 DIAGNOSIS — Z23 Encounter for immunization: Secondary | ICD-10-CM | POA: Diagnosis not present

## 2019-06-16 DIAGNOSIS — H35372 Puckering of macula, left eye: Secondary | ICD-10-CM | POA: Diagnosis not present

## 2019-08-10 ENCOUNTER — Other Ambulatory Visit: Payer: Medicare Other

## 2019-08-16 ENCOUNTER — Ambulatory Visit
Admission: RE | Admit: 2019-08-16 | Discharge: 2019-08-16 | Disposition: A | Discharge status: Home / Self Care | Payer: Medicare Other | Source: Ambulatory Visit | Attending: Family Medicine | Admitting: Family Medicine

## 2019-08-16 ENCOUNTER — Other Ambulatory Visit: Payer: Self-pay

## 2019-08-16 DIAGNOSIS — E2839 Other primary ovarian failure: Secondary | ICD-10-CM

## 2019-09-01 DIAGNOSIS — M810 Age-related osteoporosis without current pathological fracture: Secondary | ICD-10-CM | POA: Diagnosis not present

## 2019-09-06 DIAGNOSIS — M810 Age-related osteoporosis without current pathological fracture: Secondary | ICD-10-CM | POA: Diagnosis not present

## 2019-09-14 ENCOUNTER — Ambulatory Visit: Payer: Medicare Other | Attending: Internal Medicine

## 2019-09-14 DIAGNOSIS — Z23 Encounter for immunization: Secondary | ICD-10-CM | POA: Insufficient documentation

## 2019-09-14 NOTE — Progress Notes (Signed)
   Covid-19 Vaccination Clinic  Name:  Makayla Garcia    MRN: [id number] DOB: February 17, 1951  09/14/2019  Makayla Garcia was observed post Covid-19 immunization for 15 minutes without incidence. She was provided with Vaccine Information Sheet and instruction to access the V-Safe system.   Makayla Garcia was instructed to call 911 with any severe reactions post vaccine: Marland Kitchen Difficulty breathing  . Swelling of your face and throat  . A fast heartbeat  . A bad rash all over your body  . Dizziness and weakness    Immunizations Administered    Name Date Dose VIS Date Route   Pfizer COVID-19 Vaccine 09/14/2019  6:00 PM 0.3 mL 08/05/2019 Intramuscular   Manufacturer: [employer]   Lot: [id number]   [place]: [id number]

## 2019-09-29 DIAGNOSIS — Z96652 Presence of left artificial knee joint: Secondary | ICD-10-CM | POA: Diagnosis not present

## 2019-09-29 DIAGNOSIS — Z471 Aftercare following joint replacement surgery: Secondary | ICD-10-CM | POA: Diagnosis not present

## 2019-10-02 ENCOUNTER — Ambulatory Visit: Payer: Medicare Other | Attending: Internal Medicine

## 2019-10-02 DIAGNOSIS — Z23 Encounter for immunization: Secondary | ICD-10-CM | POA: Insufficient documentation

## 2019-10-02 NOTE — Progress Notes (Signed)
   Covid-19 Vaccination Clinic  Name:  Nikina Hayer    MRN: [id number] DOB: Dec 04, 1950  10/02/2019  Ms. Hohensee was observed post Covid-19 immunization for 15 minutes without incidence. She was provided with Vaccine Information Sheet and instruction to access the V-Safe system.   Ms. Barreca was instructed to call 911 with any severe reactions post vaccine: Marland Kitchen Difficulty breathing  . Swelling of your face and throat  . A fast heartbeat  . A bad rash all over your body  . Dizziness and weakness    Immunizations Administered    Name Date Dose VIS Date Route   Pfizer COVID-19 Vaccine 10/02/2019  1:33 PM 0.3 mL 08/05/2019 Intramuscular   Manufacturer: [place]   Lot: [id number]   [place]: [id number]

## 2020-03-27 DIAGNOSIS — M810 Age-related osteoporosis without current pathological fracture: Secondary | ICD-10-CM | POA: Diagnosis not present

## 2020-05-06 DIAGNOSIS — Z23 Encounter for immunization: Secondary | ICD-10-CM | POA: Diagnosis not present

## 2020-05-31 DIAGNOSIS — L821 Other seborrheic keratosis: Secondary | ICD-10-CM | POA: Diagnosis not present

## 2020-05-31 DIAGNOSIS — L820 Inflamed seborrheic keratosis: Secondary | ICD-10-CM | POA: Diagnosis not present

## 2020-05-31 DIAGNOSIS — L718 Other rosacea: Secondary | ICD-10-CM | POA: Diagnosis not present

## 2020-06-26 DIAGNOSIS — H524 Presbyopia: Secondary | ICD-10-CM | POA: Diagnosis not present

## 2020-06-26 DIAGNOSIS — H35373 Puckering of macula, bilateral: Secondary | ICD-10-CM | POA: Diagnosis not present

## 2020-06-26 DIAGNOSIS — H5203 Hypermetropia, bilateral: Secondary | ICD-10-CM | POA: Diagnosis not present

## 2020-06-26 DIAGNOSIS — H2513 Age-related nuclear cataract, bilateral: Secondary | ICD-10-CM | POA: Diagnosis not present

## 2020-06-26 DIAGNOSIS — H52221 Regular astigmatism, right eye: Secondary | ICD-10-CM | POA: Diagnosis not present

## 2020-08-29 DIAGNOSIS — Z23 Encounter for immunization: Secondary | ICD-10-CM | POA: Diagnosis not present

## 2020-08-29 DIAGNOSIS — M810 Age-related osteoporosis without current pathological fracture: Secondary | ICD-10-CM | POA: Diagnosis not present

## 2020-08-29 DIAGNOSIS — L719 Rosacea, unspecified: Secondary | ICD-10-CM | POA: Diagnosis not present

## 2020-08-29 DIAGNOSIS — Z0000 Encounter for general adult medical examination without abnormal findings: Secondary | ICD-10-CM | POA: Diagnosis not present

## 2020-08-29 DIAGNOSIS — Z1389 Encounter for screening for other disorder: Secondary | ICD-10-CM | POA: Diagnosis not present

## 2020-08-29 DIAGNOSIS — E7800 Pure hypercholesterolemia, unspecified: Secondary | ICD-10-CM | POA: Diagnosis not present

## 2020-08-29 DIAGNOSIS — Z1211 Encounter for screening for malignant neoplasm of colon: Secondary | ICD-10-CM | POA: Diagnosis not present

## 2020-09-24 ENCOUNTER — Other Ambulatory Visit: Payer: Self-pay | Admitting: Family Medicine

## 2020-09-24 DIAGNOSIS — Z1231 Encounter for screening mammogram for malignant neoplasm of breast: Secondary | ICD-10-CM

## 2020-10-05 DIAGNOSIS — M810 Age-related osteoporosis without current pathological fracture: Secondary | ICD-10-CM | POA: Diagnosis not present

## 2020-10-09 DIAGNOSIS — L720 Epidermal cyst: Secondary | ICD-10-CM | POA: Diagnosis not present

## 2020-10-09 DIAGNOSIS — L718 Other rosacea: Secondary | ICD-10-CM | POA: Diagnosis not present

## 2020-11-08 ENCOUNTER — Inpatient Hospital Stay: Admission: RE | Admit: 2020-11-08 | Payer: Medicare Other | Source: Ambulatory Visit

## 2020-12-28 ENCOUNTER — Other Ambulatory Visit: Payer: Self-pay

## 2020-12-28 ENCOUNTER — Ambulatory Visit
Admission: RE | Admit: 2020-12-28 | Discharge: 2020-12-28 | Disposition: A | Discharge status: Home / Self Care | Payer: Medicare Other | Source: Ambulatory Visit | Attending: Family Medicine | Admitting: Family Medicine

## 2020-12-28 DIAGNOSIS — Z1231 Encounter for screening mammogram for malignant neoplasm of breast: Secondary | ICD-10-CM

## 2021-01-16 DIAGNOSIS — S40011A Contusion of right shoulder, initial encounter: Secondary | ICD-10-CM | POA: Diagnosis not present

## 2021-01-16 DIAGNOSIS — S2241XA Multiple fractures of ribs, right side, initial encounter for closed fracture: Secondary | ICD-10-CM | POA: Diagnosis not present

## 2021-01-16 DIAGNOSIS — W010XXA Fall on same level from slipping, tripping and stumbling without subsequent striking against object, initial encounter: Secondary | ICD-10-CM | POA: Diagnosis not present

## 2021-01-16 DIAGNOSIS — R0781 Pleurodynia: Secondary | ICD-10-CM | POA: Diagnosis not present

## 2021-01-23 DIAGNOSIS — S2241XD Multiple fractures of ribs, right side, subsequent encounter for fracture with routine healing: Secondary | ICD-10-CM | POA: Diagnosis not present

## 2021-01-23 DIAGNOSIS — Z9181 History of falling: Secondary | ICD-10-CM | POA: Diagnosis not present

## 2021-02-19 DIAGNOSIS — R42 Dizziness and giddiness: Secondary | ICD-10-CM | POA: Diagnosis not present

## 2021-02-19 DIAGNOSIS — R519 Headache, unspecified: Secondary | ICD-10-CM | POA: Diagnosis not present

## 2021-04-17 DIAGNOSIS — M810 Age-related osteoporosis without current pathological fracture: Secondary | ICD-10-CM | POA: Diagnosis not present

## 2021-04-24 DIAGNOSIS — H52221 Regular astigmatism, right eye: Secondary | ICD-10-CM | POA: Diagnosis not present

## 2021-04-24 DIAGNOSIS — H35373 Puckering of macula, bilateral: Secondary | ICD-10-CM | POA: Diagnosis not present

## 2021-04-24 DIAGNOSIS — H2513 Age-related nuclear cataract, bilateral: Secondary | ICD-10-CM | POA: Diagnosis not present

## 2021-04-24 DIAGNOSIS — H524 Presbyopia: Secondary | ICD-10-CM | POA: Diagnosis not present

## 2021-04-24 DIAGNOSIS — H5203 Hypermetropia, bilateral: Secondary | ICD-10-CM | POA: Diagnosis not present

## 2021-04-26 DIAGNOSIS — Z23 Encounter for immunization: Secondary | ICD-10-CM | POA: Diagnosis not present

## 2021-05-02 ENCOUNTER — Other Ambulatory Visit: Payer: Self-pay | Admitting: Family Medicine

## 2021-05-02 DIAGNOSIS — E2839 Other primary ovarian failure: Secondary | ICD-10-CM

## 2021-05-02 DIAGNOSIS — M810 Age-related osteoporosis without current pathological fracture: Secondary | ICD-10-CM

## 2021-05-15 DIAGNOSIS — Z23 Encounter for immunization: Secondary | ICD-10-CM | POA: Diagnosis not present

## 2021-08-28 DIAGNOSIS — J0110 Acute frontal sinusitis, unspecified: Secondary | ICD-10-CM | POA: Diagnosis not present

## 2021-09-17 DIAGNOSIS — Z1389 Encounter for screening for other disorder: Secondary | ICD-10-CM | POA: Diagnosis not present

## 2021-09-17 DIAGNOSIS — M129 Arthropathy, unspecified: Secondary | ICD-10-CM | POA: Diagnosis not present

## 2021-09-17 DIAGNOSIS — Z0000 Encounter for general adult medical examination without abnormal findings: Secondary | ICD-10-CM | POA: Diagnosis not present

## 2021-09-17 DIAGNOSIS — L719 Rosacea, unspecified: Secondary | ICD-10-CM | POA: Diagnosis not present

## 2021-09-17 DIAGNOSIS — E7800 Pure hypercholesterolemia, unspecified: Secondary | ICD-10-CM | POA: Diagnosis not present

## 2021-09-17 DIAGNOSIS — M810 Age-related osteoporosis without current pathological fracture: Secondary | ICD-10-CM | POA: Diagnosis not present

## 2021-09-17 DIAGNOSIS — Z1159 Encounter for screening for other viral diseases: Secondary | ICD-10-CM | POA: Diagnosis not present

## 2021-09-17 DIAGNOSIS — R42 Dizziness and giddiness: Secondary | ICD-10-CM | POA: Diagnosis not present

## 2021-09-17 DIAGNOSIS — H938X3 Other specified disorders of ear, bilateral: Secondary | ICD-10-CM | POA: Diagnosis not present

## 2021-09-19 DIAGNOSIS — L438 Other lichen planus: Secondary | ICD-10-CM | POA: Diagnosis not present

## 2021-09-19 DIAGNOSIS — L718 Other rosacea: Secondary | ICD-10-CM | POA: Diagnosis not present

## 2021-09-20 DIAGNOSIS — Z96651 Presence of right artificial knee joint: Secondary | ICD-10-CM | POA: Diagnosis not present

## 2021-09-20 DIAGNOSIS — Z96652 Presence of left artificial knee joint: Secondary | ICD-10-CM | POA: Diagnosis not present

## 2021-10-03 DIAGNOSIS — D127 Benign neoplasm of rectosigmoid junction: Secondary | ICD-10-CM | POA: Diagnosis not present

## 2021-10-03 DIAGNOSIS — K5730 Diverticulosis of large intestine without perforation or abscess without bleeding: Secondary | ICD-10-CM | POA: Diagnosis not present

## 2021-10-03 DIAGNOSIS — K644 Residual hemorrhoidal skin tags: Secondary | ICD-10-CM | POA: Diagnosis not present

## 2021-10-03 DIAGNOSIS — Z86010 Personal history of colonic polyps: Secondary | ICD-10-CM | POA: Diagnosis not present

## 2021-10-03 DIAGNOSIS — K648 Other hemorrhoids: Secondary | ICD-10-CM | POA: Diagnosis not present

## 2021-10-03 DIAGNOSIS — D124 Benign neoplasm of descending colon: Secondary | ICD-10-CM | POA: Diagnosis not present

## 2021-10-09 DIAGNOSIS — D124 Benign neoplasm of descending colon: Secondary | ICD-10-CM | POA: Diagnosis not present

## 2021-10-09 DIAGNOSIS — D127 Benign neoplasm of rectosigmoid junction: Secondary | ICD-10-CM | POA: Diagnosis not present

## 2021-10-21 DIAGNOSIS — M810 Age-related osteoporosis without current pathological fracture: Secondary | ICD-10-CM | POA: Diagnosis not present

## 2021-10-22 ENCOUNTER — Ambulatory Visit
Admission: RE | Admit: 2021-10-22 | Discharge: 2021-10-22 | Disposition: A | Discharge status: Home / Self Care | Payer: Medicare Other | Source: Ambulatory Visit | Attending: Family Medicine | Admitting: Family Medicine

## 2021-10-22 ENCOUNTER — Other Ambulatory Visit: Payer: Self-pay

## 2021-10-22 DIAGNOSIS — M8588 Other specified disorders of bone density and structure, other site: Secondary | ICD-10-CM | POA: Diagnosis not present

## 2021-10-22 DIAGNOSIS — E2839 Other primary ovarian failure: Secondary | ICD-10-CM

## 2021-10-22 DIAGNOSIS — H9113 Presbycusis, bilateral: Secondary | ICD-10-CM | POA: Diagnosis not present

## 2021-10-22 DIAGNOSIS — M810 Age-related osteoporosis without current pathological fracture: Secondary | ICD-10-CM

## 2021-10-22 DIAGNOSIS — R42 Dizziness and giddiness: Secondary | ICD-10-CM | POA: Diagnosis not present

## 2021-10-22 DIAGNOSIS — Z780 Asymptomatic menopausal state: Secondary | ICD-10-CM | POA: Diagnosis not present

## 2021-11-01 ENCOUNTER — Other Ambulatory Visit (HOSPITAL_COMMUNITY): Payer: Self-pay | Admitting: Otolaryngology

## 2021-11-01 ENCOUNTER — Other Ambulatory Visit: Payer: Self-pay | Admitting: Otolaryngology

## 2021-11-01 DIAGNOSIS — R42 Dizziness and giddiness: Secondary | ICD-10-CM

## 2021-11-05 DIAGNOSIS — H903 Sensorineural hearing loss, bilateral: Secondary | ICD-10-CM | POA: Diagnosis not present

## 2021-12-02 ENCOUNTER — Other Ambulatory Visit: Payer: Self-pay | Admitting: Otolaryngology

## 2021-12-02 DIAGNOSIS — R42 Dizziness and giddiness: Secondary | ICD-10-CM

## 2021-12-05 ENCOUNTER — Other Ambulatory Visit: Payer: Self-pay | Admitting: Family Medicine

## 2021-12-05 DIAGNOSIS — Z1231 Encounter for screening mammogram for malignant neoplasm of breast: Secondary | ICD-10-CM

## 2021-12-25 DIAGNOSIS — L237 Allergic contact dermatitis due to plants, except food: Secondary | ICD-10-CM | POA: Diagnosis not present

## 2021-12-31 ENCOUNTER — Ambulatory Visit
Admission: RE | Admit: 2021-12-31 | Discharge: 2021-12-31 | Disposition: A | Discharge status: Home / Self Care | Payer: Medicare Other | Source: Ambulatory Visit | Attending: Otolaryngology | Admitting: Otolaryngology

## 2021-12-31 DIAGNOSIS — I771 Stricture of artery: Secondary | ICD-10-CM | POA: Diagnosis not present

## 2021-12-31 DIAGNOSIS — R42 Dizziness and giddiness: Secondary | ICD-10-CM

## 2021-12-31 DIAGNOSIS — M47812 Spondylosis without myelopathy or radiculopathy, cervical region: Secondary | ICD-10-CM | POA: Diagnosis not present

## 2021-12-31 MED ORDER — IOPAMIDOL (ISOVUE-370) INJECTION 76%
75.0000 mL | Freq: Once | INTRAVENOUS | Status: AC | PRN
  Administered 2021-12-31: 75 mL via INTRAVENOUS

## 2022-01-06 ENCOUNTER — Ambulatory Visit
Admission: RE | Admit: 2022-01-06 | Discharge: 2022-01-06 | Disposition: A | Discharge status: Home / Self Care | Payer: Medicare Other | Source: Ambulatory Visit | Attending: Family Medicine | Admitting: Family Medicine

## 2022-01-06 DIAGNOSIS — Z1231 Encounter for screening mammogram for malignant neoplasm of breast: Secondary | ICD-10-CM | POA: Diagnosis not present

## 2022-04-21 DIAGNOSIS — M810 Age-related osteoporosis without current pathological fracture: Secondary | ICD-10-CM | POA: Diagnosis not present

## 2022-05-07 DIAGNOSIS — U071 COVID-19: Secondary | ICD-10-CM | POA: Diagnosis not present

## 2022-05-22 DIAGNOSIS — Z23 Encounter for immunization: Secondary | ICD-10-CM | POA: Diagnosis not present

## 2022-05-28 ENCOUNTER — Telehealth: Payer: Self-pay | Admitting: *Deleted

## 2022-05-28 NOTE — Chronic Care Management (AMB) (Signed)
  Care Coordination  Outreach Note  05/28/2022 Name: MARDELLE PANDOLFI MRN: [id number] DOB: 1951/03/14   Care Coordination Outreach Attempts: An unsuccessful telephone outreach was attempted today to offer the patient information about available care coordination services as a benefit of their health plan.   Follow Up Plan:  Additional outreach attempts will be made to offer the patient care coordination information and services.   Encounter Outcome:  No Answer  [place]  Direct Dial: [phone]

## 2022-05-28 NOTE — Chronic Care Management (AMB) (Signed)
  Care Coordination   Note   05/28/2022 Name: Makayla Garcia MRN: [id number] DOB: 04-05-1951  [name] is a 71 y.o. year old female who sees Carol Ada, MD for primary care. I reached out to Violet Baldy by phone today to offer care coordination services.  [name] was given information about Care Coordination services today including:   The Care Coordination services include support from the care team which includes your Nurse Coordinator, Clinical Social Worker, or Pharmacist.  The Care Coordination team is here to help remove barriers to the health concerns and goals most important to you. Care Coordination services are voluntary, and the patient may decline or stop services at any time by request to their care team member.   Care Coordination Consent Status: Patient agreed to services and verbal consent obtained.   Follow up plan:  Telephone appointment with care coordination team member scheduled for:  06/17/22  Encounter Outcome:  Pt. Scheduled  [place]  Direct Dial: [phone]

## 2022-05-30 DIAGNOSIS — H53143 Visual discomfort, bilateral: Secondary | ICD-10-CM | POA: Diagnosis not present

## 2022-05-30 DIAGNOSIS — H524 Presbyopia: Secondary | ICD-10-CM | POA: Diagnosis not present

## 2022-05-30 DIAGNOSIS — H52223 Regular astigmatism, bilateral: Secondary | ICD-10-CM | POA: Diagnosis not present

## 2022-05-30 DIAGNOSIS — H5203 Hypermetropia, bilateral: Secondary | ICD-10-CM | POA: Diagnosis not present

## 2022-06-17 ENCOUNTER — Ambulatory Visit: Payer: Self-pay

## 2022-06-17 NOTE — Patient Instructions (Signed)
Visit Information  Thank you for taking time to visit with me today. Please don't hesitate to contact me if I can be of assistance to you.   Following are the goals we discussed today:   Goals Addressed             This Visit's Progress    Care Coordination Activites- no follow up required        Care Coordination Interventions: Active listening / Reflection utilized  Emotional Support Provided Problem Icard strategies reviewed Discussed/.Educated Care Coordination Program 2.   Discussed/.Educated Annual Wellness Visit 3.   Discussed/.Educated Social Determinates of Health 4.   Please inform PCP if services needed in the future  During our conversation, Mrs. Kenealy expressed her desire to lower her triglycerides. I advised her to consider reducing her alcohol intake ( she is an occasional drinker), avoiding excess sugar, continuing with exercise, selecting healthier fats, eliminating trans fats, and consuming good carbs. She did not raise any other concerns during our discussion.        If you are experiencing a Mental Health or [place] or need someone to talk to, please call 1-800-273-TALK (toll free, 24 hour hotline)  Patient verbalizes understanding of instructions and care plan provided today and agrees to view in [place]. Active MyChart status and patient understanding of how to access instructions and care plan via MyChart confirmed with patient.     Lazaro Arms RN, BSN, [facility]    Phone: [phone]

## 2022-06-17 NOTE — Patient Outreach (Signed)
  Care Coordination   Initial Visit Note   06/17/2022 Name: Makayla Garcia MRN: [id number] DOB: 1950-10-30  Makayla Garcia is a 71 y.o. year old female who sees Makayla Ada, MD for primary care. I spoke with  Makayla Garcia by phone today.  What matters to the patients health and wellness today?  I want to lower Triglycerides   Goals Addressed             This Visit's Progress    Care Coordination Activites- no follow up required        Care Coordination Interventions: Active listening / Reflection utilized  Emotional Support Provided Problem Makayla Garcia strategies reviewed Discussed/.Educated Care Coordination Program 2.   Discussed/.Educated Annual Wellness Visit 3.   Discussed/.Educated Social Determinates of Health 4.   Please inform PCP if services needed in the future  During our conversation, Makayla Garcia expressed her desire to lower her triglycerides. I advised her to consider reducing her alcohol intake ( she is an occasional drinker), avoiding excess sugar, continuing with exercise, selecting healthier fats, eliminating trans fats, and consuming good carbs. She did not raise any other concerns during our discussion.        SDOH assessments and interventions completed:  Yes  SDOH Interventions Today    Flowsheet Row Most Recent Value  SDOH Interventions   Food Insecurity Interventions Intervention Not Indicated  Transportation Interventions Intervention Not Indicated        Care Coordination Interventions Activated:  Yes  Care Coordination Interventions:  Yes, provided   Follow up plan: No further intervention required.   Encounter Outcome:  Pt. Visit Completed   Makayla Arms RN, BSN, [facility]   Phone: [phone]

## 2022-10-08 DIAGNOSIS — H8110 Benign paroxysmal vertigo, unspecified ear: Secondary | ICD-10-CM | POA: Diagnosis not present

## 2022-10-08 DIAGNOSIS — M810 Age-related osteoporosis without current pathological fracture: Secondary | ICD-10-CM | POA: Diagnosis not present

## 2022-10-08 DIAGNOSIS — Z0000 Encounter for general adult medical examination without abnormal findings: Secondary | ICD-10-CM | POA: Diagnosis not present

## 2022-10-08 DIAGNOSIS — E7800 Pure hypercholesterolemia, unspecified: Secondary | ICD-10-CM | POA: Diagnosis not present

## 2022-10-08 DIAGNOSIS — Z1331 Encounter for screening for depression: Secondary | ICD-10-CM | POA: Diagnosis not present

## 2022-10-10 ENCOUNTER — Ambulatory Visit: Payer: Medicare Other | Attending: Family Medicine

## 2022-10-10 DIAGNOSIS — R42 Dizziness and giddiness: Secondary | ICD-10-CM | POA: Insufficient documentation

## 2022-10-10 NOTE — Therapy (Addendum)
OUTPATIENT PHYSICAL THERAPY VESTIBULAR EVALUATION     Patient Name: Makayla Garcia MRN: [id number] DOB:[birth year], 72 y.o., female Today's Date: 10/10/2022  END OF SESSION:  PT End of Session - 10/10/22 1453     Visit Number 1    Number of Visits 1    Authorization Type Medicare    PT Start Time 1450   patient late   PT Stop Time 1535    PT Time Calculation (min) 45 min    Activity Tolerance Patient tolerated treatment well    Behavior During Therapy WFL for tasks assessed/performed             Past Medical History:  Diagnosis Date   Arthritis    Asthma    Headache    hx of migraines   Peripheral vascular disease ([place])    vein crushed in left leg several years ago not currently a problem    Past Surgical History:  Procedure Laterality Date   cesearean section      x 2    right hand surgery      to remove cyst    right knee arthroscopy      TONSILLECTOMY     TOTAL KNEE ARTHROPLASTY Right 09/25/2014   Procedure: RIGHT TOTAL KNEE ARTHROPLASTY LEFT KNEE INJECTION;  Surgeon: Gearlean Alf, MD;  Location: [facility];  Service: Orthopedics;  Laterality: Right;   TOTAL KNEE ARTHROPLASTY Left 09/27/2018   Procedure: TOTAL KNEE ARTHROPLASTY;  Surgeon: Gaynelle Arabian, MD;  Location: [facility];  Service: Orthopedics;  Laterality: Left;  47mn   WISDOM TOOTH EXTRACTION     Patient Active Problem List   Diagnosis Date Noted   OA (osteoarthritis) of knee 09/25/2014   GERD 10/30/2009   KNEE PAIN 10/30/2009   FINGER PAIN 10/30/2009   PLANTAR FASCIITIS 05/25/2008   OSTEOPENIA 12/01/2007   FEVER BLISTER 11/23/2006   ALLERGIC RHINITIS 11/23/2006   ASTHMA 11/23/2006   FIBROCYSTIC BREAST DISEASE 11/23/2006    PCP: SCarol Ada MD REFERRING PROVIDER: SCarol Ada MD  REFERRING DIAG:   THERAPY DIAG:  Dizziness and giddiness - Plan: PT plan of care cert/re-cert  ONSET DATE: [id number] Rationale for Evaluation and Treatment: Rehabilitation  SUBJECTIVE:   SUBJECTIVE  STATEMENT: Patient reports dizziness started ~ 2 years ago and has gotten progressively worse. Rolling over in bed, sitting up in bed, used to fatigue out during the day, but no longer. Now with some dizziness if she reclines in recliner and then sits up. Did have dizziness looking up and to the L in the kitchen. Dizziness has not contributed to any falls. Biggest contributing factor to dizziness seems to be looking up and down. Does wake up in the morning with a stiff neck. Patient did mention this to dr a year ago and dr instructed her to do the Epley maneuver and that did not have any impact. Patient does wear progressive lenses with a prism in the L eye. Does wear hearing aids now too.  Pt accompanied by: self  PERTINENT HISTORY: B TKA, osteoporosis, B hearing loss, previous BPPV   PAIN:  Are you having pain?  Neck stiffness  PRECAUTIONS: Fall  WEIGHT BEARING RESTRICTIONS: No  FALLS: Has patient fallen in last 6 months? No  LIVING ENVIRONMENT: Lives with: lives with their spouse Lives in: House/apartment Stairs: No Has following equipment at home: Single point cane  PLOF: Independent driving carefully   PATIENT GOALS: "to get rid of the dizziness"  OBJECTIVE:   DIAGNOSTIC FINDINGS:  12/31/21 neck CT angio: IMPRESSION: No hemodynamically significant stenosis in the neck.  COGNITION: Overall cognitive status: Within functional limits for tasks assessed   SENSATION: WFL  DTRs:  -no clonus  POSTURE:  No Significant postural limitations  Cervical ROM:    Active A/PROM (deg) eval  Flexion dizziness  Extension dizziness  Right lateral flexion   Left lateral flexion dizziness  Right rotation dizziness  Left rotation dizziness  (Blank rows = not tested)  STRENGTH: WFL  BED MOBILITY:  Rolling over and sitting up increases her dizziness, landing with pressure in suboccipitals increases dizziness   PATIENT SURVEYS:  FOTO 52, expected to be at 61  VESTIBULAR  ASSESSMENT:  GENERAL OBSERVATION: NAD   SYMPTOM BEHAVIOR:  Subjective history: see above  Non-Vestibular symptoms:  none  Type of dizziness: "Swimmyheaded" and "intoxicated"  Frequency: all day every day   Duration: all day everyday   Aggravating factors: Spontaneous, Induced by position change: rolling to the right, rolling to the left, and supine to sit, Induced by motion: looking up at the ceiling, bending down to the ground, turning body quickly, and turning head quickly, Worse with fatigue, and Moving eyes  Relieving factors: head stationary, closing eyes, and rest  Progression of symptoms: worse  OCULOMOTOR EXAM:  Ocular Alignment: normal  Ocular ROM: No Limitations  Spontaneous Nystagmus: absent  Gaze-Induced Nystagmus: absent  Smooth Pursuits: intact did report some dizziness with lateral tracking L >R  Saccades: intact  Convergence/Divergence: ~20 cm   VESTIBULAR - OCULAR REFLEX:   Slow VOR: Normal did report some dizziness with increased length of testing  VOR Cancellation: Normal  Head-Impulse Test: HIT Right: positive HIT Left: positive  Test of skew: (-) B  Dynamic Visual Acuity: to be assessed    MOTION SENSITIVITY:  Motion Sensitivity Quotient Intensity: 0 = none, 1 = Lightheaded, 2 = Mild, 3 = Moderate, 4 = Severe, 5 = Vomiting  Intensity  1. Sitting to supine 4.5  2. Supine to L side 4.5  3. Supine to R side 2  4. Supine to sitting 4.5  5. L Hallpike-Dix   6. Up from L    7. R Hallpike-Dix   8. Up from R    9. Sitting, head tipped to L knee 3  10. Head up from L knee 3  11. Sitting, head tipped to R knee 2  12. Head up from R knee 1  13. Sitting head turns x5 1  14.Sitting head nods x5 3.5  15. In stance, 180 turn to L  1  16. In stance, 180 turn to R 2     VESTIBULAR TREATMENT:                                                                                                   N/A eval  PATIENT EDUCATION: Education details: PT POC, exam  findings, need for further medical work up Person educated: Patient Education method: Explanation Education comprehension: verbalized understanding and needs further education  HOME EXERCISE PROGRAM: To be provided  GOALS: Not needed as PT is not indicated at  this time  ASSESSMENT:  CLINICAL IMPRESSION: Patient is a 72 y.o. female who was seen today for physical therapy evaluation and treatment for dizziness. Patient with longstanding history of dizziness of unknown origin. It was initially intermittent dizziness, progressing to daily and relentless with exacerbations with certain movements. She reports increased dizziness with head movements in essentially all planes, as well as eye movement laterally. No overt central signs noted, but patient does have profound motion sensitivity with essentially all movements. Impaired VOR, but normal VOR cancellation and impaired B HIT, test of skew (-) bilaterally. Due to patients worsening and persistent dizziness of unknown etiology she requires further medical work up prior to returning to PT.    CLINICAL DECISION MAKING: Unstable/unpredictable  EVALUATION COMPLEXITY: High   PLAN:  PT FREQUENCY: one time visit  PT DURATION: other: 1x visit   Debbora Dus, PT Debbora Dus, PT, DPT, CBIS  10/10/2022, 3:46 PM

## 2022-10-23 DIAGNOSIS — M810 Age-related osteoporosis without current pathological fracture: Secondary | ICD-10-CM | POA: Diagnosis not present

## 2022-11-24 ENCOUNTER — Other Ambulatory Visit: Payer: Self-pay | Admitting: Family Medicine

## 2022-11-24 DIAGNOSIS — Z1231 Encounter for screening mammogram for malignant neoplasm of breast: Secondary | ICD-10-CM

## 2022-12-03 DIAGNOSIS — H524 Presbyopia: Secondary | ICD-10-CM | POA: Diagnosis not present

## 2022-12-16 ENCOUNTER — Encounter: Payer: Self-pay | Admitting: Neurology

## 2022-12-16 ENCOUNTER — Ambulatory Visit: Payer: Medicare Other | Admitting: Neurology

## 2022-12-16 VITALS — BP 141/88 | HR 73 | Ht 63.5 in | Wt 166.2 lb

## 2022-12-16 DIAGNOSIS — G250 Essential tremor: Secondary | ICD-10-CM | POA: Diagnosis not present

## 2022-12-16 DIAGNOSIS — R42 Dizziness and giddiness: Secondary | ICD-10-CM | POA: Diagnosis not present

## 2022-12-16 MED ORDER — MECLIZINE HCL 12.5 MG PO TABS: 12.5000 mg | ORAL_TABLET | Freq: Three times a day (TID) | ORAL | 0 refills | Status: DC | PRN

## 2022-12-16 NOTE — Patient Instructions (Signed)
MRI Brain without contrast  Trial of Meclizine  Patient will contact us if she wants to move forward with treatment of her essential tremors  Follow up in 6 months or sooner if worse

## 2022-12-16 NOTE — Progress Notes (Signed)
[facility]  PATIENT: Makayla Garcia DOB: 03/30/1951  REQUESTING CLINICIAN: Merri Brunette, MD HISTORY FROM: Patient  REASON FOR VISIT: Ongoing dizziness    HISTORICAL  CHIEF COMPLAINT:  Chief Complaint  Patient presents with   New Patient (Initial Visit)    Rm 17, alone dizziness, vertigo ongoing for 2 years     HISTORY OF PRESENT ILLNESS:  This is a 72 year old woman past medical history of dizziness, headaches, asthma who is presenting with ongoing dizziness.  Patient reports having dizziness for the past couple years.  She describes dizziness as feeling drunk, looking up make it worse.  She feels like at times someone is pushing a back.  She will have to hold onto the walls or things to prevent fall.  She has not seen an ENT in the past, had not had any initial workup and has not been on medication including meclizine for the dizziness.  She was referred to vestibular therapy but they had recommended her to see a neurologist.  No other complaints.   OTHER MEDICAL CONDITIONS: Dizziness, Headaches    REVIEW OF SYSTEMS: Full 14 system review of systems performed and negative with exception of: As noted in the HPI   ALLERGIES: Allergies  Allergen Reactions   Oxycodone-Acetaminophen     Other Reaction(s): Not available  Other Reaction(s): GI Intolerance  Severe stomach pain   Percocet [Oxycodone-Acetaminophen]     Severe stomach pain   Promethazine Hcl     Other Reaction(s): Not available   Sulfonamide Derivatives Rash    Other Reaction(s): Not available    HOME MEDICATIONS: Outpatient Medications Prior to Visit  Medication Sig Dispense Refill   acetaminophen (TYLENOL) 650 MG suppository Place 650 mg rectally every 4 (four) hours as needed.     Azelaic Acid 15 % gel Apply 1 Application topically 2 (two) times daily.     calcium carbonate (TUMS - DOSED IN MG ELEMENTAL CALCIUM) 500 MG chewable tablet Chew 1 tablet by mouth in the morning, at noon,  in the evening, and at bedtime.     cetirizine (ZYRTEC) 10 MG tablet Take 10 mg by mouth daily.     Cholecalciferol (VITAMIN D) 50 MCG (2000 UT) CAPS Take 2,000 Units by mouth daily.     denosumab (PROLIA) 60 MG/ML SOSY injection Inject 60 mg into the skin every 6 (six) months.     doxycycline (ADOXA) 100 MG tablet Take 100 mg by mouth 2 (two) times daily.     fluticasone (FLONASE) 50 MCG/ACT nasal spray Place 2 sprays into both nostrils daily.      lactobacillus acidophilus (BACID) TABS tablet Take 1 tablet by mouth daily.     metroNIDAZOLE (METROGEL) 0.75 % gel Apply 1 Application topically 2 (two) times daily.     Multiple Vitamin (MULTIVITAMIN WITH MINERALS) TABS tablet Take 1 tablet by mouth daily.     acetaminophen (TYLENOL) 650 MG CR tablet Take 650 mg by mouth every 8 (eight) hours as needed for pain.     alendronate (FOSAMAX) 70 MG tablet Take 70 mg by mouth once a week. Take with a full glass of water on an empty stomach. (Patient not taking: Reported on 06/17/2022)     doxycycline (ADOXA) 100 MG tablet Take 100 mg by mouth 2 (two) times daily. (Patient not taking: Reported on 06/17/2022)     esomeprazole (NEXIUM) 20 MG capsule Take 20 mg by mouth daily. (Patient not taking: Reported on 10/10/2022)     gabapentin (NEURONTIN)  300 MG capsule Take 1 capsule (300 mg total) by mouth 3 (three) times daily. Take a 300 mg capsule three times a day for two weeks following surgery.Then take a 300 mg capsule two times a day for two weeks. Then take a 300 mg capsule once a day for two weeks. Then discontinue. (Patient not taking: Reported on 06/17/2022) 84 capsule 0   HYDROcodone-acetaminophen (NORCO/VICODIN) 5-325 MG tablet Take 1-2 tablets by mouth every 6 (six) hours as needed for severe pain. (Patient not taking: Reported on 06/17/2022) 56 tablet 0   methocarbamol (ROBAXIN) 500 MG tablet Take 1 tablet (500 mg total) by mouth every 6 (six) hours as needed for muscle spasms. (Patient not taking:  Reported on 06/17/2022) 40 tablet 0   traMADol (ULTRAM) 50 MG tablet Take 1-2 tablets (50-100 mg total) by mouth every 6 (six) hours as needed for moderate pain. (Patient not taking: Reported on 06/17/2022) 40 tablet 0   No facility-administered medications prior to visit.    PAST MEDICAL HISTORY: Past Medical History:  Diagnosis Date   Arthritis    Asthma    Headache    hx of migraines   Peripheral vascular disease ([facility])    vein crushed in left leg several years ago not currently a problem     PAST SURGICAL HISTORY: Past Surgical History:  Procedure Laterality Date   cesearean section      x 2    right hand surgery      to remove cyst    right knee arthroscopy      TONSILLECTOMY     TOTAL KNEE ARTHROPLASTY Right 09/25/2014   Procedure: RIGHT TOTAL KNEE ARTHROPLASTY LEFT KNEE INJECTION;  Surgeon: Loanne Drilling, MD;  Location: [facility];  Service: Orthopedics;  Laterality: Right;   TOTAL KNEE ARTHROPLASTY Left 09/27/2018   Procedure: TOTAL KNEE ARTHROPLASTY;  Surgeon: Ollen Gross, MD;  Location: [facility];  Service: Orthopedics;  Laterality: Left;    WISDOM TOOTH EXTRACTION      FAMILY HISTORY: History reviewed. No pertinent family history.  SOCIAL HISTORY: Social History   Socioeconomic History   Marital status: Married    Spouse name: Not on file   Number of children: 2   Years of education: Not on file   Highest education level: Not on file  Occupational History   Not on file  Tobacco Use   Smoking status: Never   Smokeless tobacco: Never  Vaping Use   Vaping Use: Never used  Substance and Sexual Activity   Alcohol use: Not Currently    Comment: rare   Drug use: No   Sexual activity: Yes  Other Topics Concern   Not on file  Social History Narrative   Not on file   Social Determinants of Health   Financial Resource Strain: Not on file  Food Insecurity: Not on file  Transportation Needs: Not on file  Physical Activity: Not on file  Stress: Not on  file  Social Connections: Not on file  Intimate Partner Violence: Not on file     PHYSICAL EXAM  GENERAL EXAM/CONSTITUTIONAL: Vitals:  Vitals:   12/16/22 1419  BP: (!) 141/88  Pulse: 73  Weight: 166 lb 3.2 oz (75.4 kg)  Height: 5' 3.5" (1.613 m)   Body mass index is 28.98 kg/m. Wt Readings from Last 3 Encounters:  12/16/22 166 lb 3.2 oz (75.4 kg)  09/27/18 182 lb 15.7 oz (83 kg)  09/22/18 183 lb (83 kg)   Patient is in  no distress; well developed, nourished and groomed; neck is supple   MUSCULOSKELETAL: Gait, strength, tone, movements noted in Neurologic exam below  NEUROLOGIC: MENTAL STATUS:      No data to display         awake, alert, oriented to person, place and time recent and remote memory intact normal attention and concentration language fluent, comprehension intact, naming intact fund of knowledge appropriate  CRANIAL NERVE:  2nd, 3rd, 4th, 6th - Visual fields full to confrontation, extraocular muscles intact, no nystagmus 5th - facial sensation symmetric 7th - facial strength symmetric 8th - hearing intact 9th - palate elevates symmetrically, uvula midline 11th - shoulder shrug symmetric 12th - tongue protrusion midline  MOTOR:  normal bulk and tone, full strength in the BUE, BLE  SENSORY:  normal and symmetric to light touch  COORDINATION:  Presence of action tremors   GAIT/STATION:  normal     DIAGNOSTIC DATA (LABS, IMAGING, TESTING) - I reviewed patient records, labs, notes, testing and imaging myself where available.  Lab Results  Component Value Date   WBC 12.5 (H) 09/28/2018   HGB 11.9 (L) 09/28/2018   HCT 37.9 09/28/2018   MCV 94.3 09/28/2018   PLT 193 09/28/2018      Component Value Date/Time   NA 140 09/28/2018 0526   K 4.7 09/28/2018 0526   CL 108 09/28/2018 0526   CO2 25 09/28/2018 0526   GLUCOSE 127 (H) 09/28/2018 0526   BUN 13 09/28/2018 0526   CREATININE 0.68 09/28/2018 0526   CALCIUM 8.6 (L) 09/28/2018  0526   PROT 7.3 09/22/2018 1102   ALBUMIN 4.4 09/22/2018 1102   AST 31 09/22/2018 1102   ALT 43 09/22/2018 1102   ALKPHOS 99 09/22/2018 1102   BILITOT 0.9 09/22/2018 1102   GFRNONAA >60 09/28/2018 0526   GFRAA >60 09/28/2018 0526   Lab Results  Component Value Date   CHOL 199 10/30/2009   HDL 47.30 10/30/2009   LDLCALC 124 (H) 10/30/2009   LDLDIRECT 111.1 10/28/2006   TRIG 138.0 10/30/2009   CHOLHDL 4 10/30/2009   No results found for: "HGBA1C" No results found for: "VITAMINB12" Lab Results  Component Value Date   TSH 0.78 10/30/2009      ASSESSMENT AND PLAN  73 y.o. year old female with history of dizziness, migraine headaches, asthma who is presenting with ongoing dizziness for the past couple years.  Dizziness described as feeling drunk, like someone is pushing her back.  She had had not seen ENT in the past, plan will be initially to obtain a MRI brain to rule out structural abnormality that can explain the dizziness.  If normal, we will refer her back to vestibular therapy.  I will also give her a trial of meclizine. She was found to have essential tremor, currently she said that her writing is affected but and she wants to hold medication.  I did advise her to contact me if she wants to move forward with medication, due to her history of asthma, will likely start her on primidone.  I will contact her to go over the result otherwise see her in 6 months for follow-up.   1. Dizziness   2. Dysequilibrium   3. Essential tremor      Patient Instructions  MRI Brain without contrast  Trial of Meclizine  Patient will contact us if she wants to move forward with treatment of her essential tremors  Follow up in 6 months or sooner if worse   Orders  Placed This Encounter  Procedures   MR BRAIN WO CONTRAST    Meds ordered this encounter  Medications   meclizine (ANTIVERT) 12.5 MG tablet    Sig: Take 1 tablet (12.5 mg total) by mouth 3 (three) times daily as needed for  dizziness.    Dispense:  90 tablet    Refill:  0    Return in about 6 months (around 06/17/2023).    [name], MD 12/19/2022, 8:21 AM  [facility] [address] [place], [place] [address] [phone]

## 2022-12-17 ENCOUNTER — Telehealth: Payer: Self-pay | Admitting: Neurology

## 2022-12-17 NOTE — Telephone Encounter (Signed)
[employer] medicare Berkley Harvey: [phone] exp. 12/17/22-01/15/23 sent to GI [phone]

## 2023-01-16 ENCOUNTER — Ambulatory Visit
Admission: RE | Admit: 2023-01-16 | Discharge: 2023-01-16 | Disposition: A | Discharge status: Home / Self Care | Payer: Medicare Other | Source: Ambulatory Visit | Attending: Family Medicine | Admitting: Family Medicine

## 2023-01-16 DIAGNOSIS — Z1231 Encounter for screening mammogram for malignant neoplasm of breast: Secondary | ICD-10-CM

## 2023-01-22 DIAGNOSIS — K080 Exfoliation of teeth due to systemic causes: Secondary | ICD-10-CM | POA: Diagnosis not present

## 2023-02-06 ENCOUNTER — Encounter: Payer: Self-pay | Admitting: Neurology

## 2023-02-09 ENCOUNTER — Other Ambulatory Visit: Payer: Self-pay | Admitting: Neurology

## 2023-02-09 ENCOUNTER — Telehealth: Payer: Self-pay

## 2023-02-09 ENCOUNTER — Ambulatory Visit
Admission: RE | Admit: 2023-02-09 | Discharge: 2023-02-09 | Disposition: A | Discharge status: Home / Self Care | Payer: Medicare Other | Source: Ambulatory Visit | Attending: Neurology | Admitting: Neurology

## 2023-02-09 DIAGNOSIS — R42 Dizziness and giddiness: Secondary | ICD-10-CM

## 2023-02-09 NOTE — Telephone Encounter (Signed)
Request from pharmacy to refill meclizine. Last visit note states trail was started and patient reports overall symptom relief and improvement. At times may still have some dizziness but feels like medications has been helpful. Verbal order from Dr. Teresa Coombs to refill prescription.

## 2023-02-14 ENCOUNTER — Other Ambulatory Visit: Payer: Self-pay | Admitting: Neurology

## 2023-02-14 DIAGNOSIS — R42 Dizziness and giddiness: Secondary | ICD-10-CM

## 2023-02-14 NOTE — Progress Notes (Signed)
Makayla Garcia  Your Brain MRI did not show any acute findings, such as a stroke, or mass to explain your dizziness. I will refer you to vestibular therapy. I shows small vessel disease.  Please keep any upcoming appointments or tests and  call us with any interim questions, concerns, problems or updates. Thanks,   [name], MD

## 2023-02-16 ENCOUNTER — Telehealth: Payer: Self-pay | Admitting: Neurology

## 2023-02-16 NOTE — Telephone Encounter (Signed)
[facility]:   Phone: [phone]   Fax: [phone]

## 2023-04-24 DIAGNOSIS — M810 Age-related osteoporosis without current pathological fracture: Secondary | ICD-10-CM | POA: Diagnosis not present

## 2023-05-07 ENCOUNTER — Telehealth: Payer: Self-pay | Admitting: Neurology

## 2023-05-07 ENCOUNTER — Other Ambulatory Visit: Payer: Self-pay

## 2023-05-07 MED ORDER — MECLIZINE HCL 12.5 MG PO TABS
12.5000 mg | ORAL_TABLET | Freq: Three times a day (TID) | ORAL | 1 refills | Status: AC | PRN
Start: 2023-05-07 — End: ?

## 2023-05-07 NOTE — Telephone Encounter (Signed)
 Pt is asking for a call with results to her MRI

## 2023-05-07 NOTE — Telephone Encounter (Signed)
Pt is requesting a refill for meclizine (ANTIVERT) 12.5 MG tablet .  Pharmacy: [facility] # [phone]

## 2023-05-07 NOTE — Telephone Encounter (Signed)
The MRI in June or does she had a new MRI.

## 2023-05-07 NOTE — Progress Notes (Signed)
Meclizine refill sent per patient request

## 2023-05-07 NOTE — Telephone Encounter (Signed)
Call to patient, no answer. Left message to return call.

## 2023-05-25 ENCOUNTER — Telehealth: Payer: Self-pay | Admitting: Neurology

## 2023-05-25 NOTE — Telephone Encounter (Signed)
Pt called stating that she can not get in to her Mychart and is wanting an RN to call her to discuss her MRI Results from the MRI she had done a couple of months ago.

## 2023-05-25 NOTE — Telephone Encounter (Signed)
Called and relayed result to pt:  [name], MD 02/14/2023 10:11 AM EDT     Makayla Garcia   Your Brain MRI did not show any acute findings, such as a stroke, or mass to explain your dizziness. I will refer you to vestibular therapy. I shows small vessel disease. Please keep any upcoming appointments or tests and  call us with any interim questions, concerns, problems or updates. Thanks,   [name], MD

## 2023-06-17 ENCOUNTER — Encounter: Payer: Self-pay | Admitting: Neurology

## 2023-06-17 ENCOUNTER — Ambulatory Visit: Payer: Medicare Other | Admitting: Neurology

## 2023-06-17 VITALS — Resp 15 | Ht 62.0 in

## 2023-06-17 DIAGNOSIS — R42 Dizziness and giddiness: Secondary | ICD-10-CM | POA: Diagnosis not present

## 2023-06-17 DIAGNOSIS — G250 Essential tremor: Secondary | ICD-10-CM | POA: Diagnosis not present

## 2023-06-17 NOTE — Patient Instructions (Signed)
Continue current medications Referral to vestibular therapy Increase your water intake, at least 48 hours daily Please contact us if your tremor get worse and you would like to start medication Continue to follow with PCP and return as needed.

## 2023-06-17 NOTE — Progress Notes (Signed)
[facility]  PATIENT: Makayla Garcia DOB: 1951-03-05  REQUESTING CLINICIAN: Merri Brunette, MD HISTORY FROM: Patient  REASON FOR VISIT: Ongoing dizziness    HISTORICAL  CHIEF COMPLAINT:  Chief Complaint  Patient presents with   Follow-up    Rm13, alone, ORTHOSTATIC BPcompleted DIZZINESS,ESSENTIAL TREMOR: when picking up coffee pot the certain way she holds her left hand prodominantly    INTERVAL HISTORY 06/17/2023:  Patient presents today for follow-up, last visit was in April, at that time she completed the MRI brain which showed no acute abnormality to explain the dizziness.  I have referred her to physical therapy but for some reason patient was not called and has not completed therapy.  She reports that the dizziness is still present but less intense.  She denies any new fall.  She is interested in starting physical/Vestibular therapy.  No other complaint no other concerns. Reports that her tremors are minimal.    HISTORY OF PRESENT ILLNESS:  This is a 72 year old woman past medical history of dizziness, headaches, asthma who is presenting with ongoing dizziness.  Patient reports having dizziness for the past couple years.  She describes dizziness as feeling drunk, looking up make it worse.  She feels like at times someone is pushing a back.  She will have to hold onto the walls or things to prevent fall.  She has not seen an ENT in the past, had not had any initial workup and has not been on medication including meclizine for the dizziness.  She was referred to vestibular therapy but they had recommended her to see a neurologist.  No other complaints.   OTHER MEDICAL CONDITIONS: Dizziness, Headaches    REVIEW OF SYSTEMS: Full 14 system review of systems performed and negative with exception of: As noted in the HPI   ALLERGIES: Allergies  Allergen Reactions   Oxycodone-Acetaminophen     Other Reaction(s): Not available  Other Reaction(s): GI  Intolerance  Severe stomach pain   Percocet [Oxycodone-Acetaminophen]     Severe stomach pain   Promethazine Hcl     Other Reaction(s): Not available   Sulfonamide Derivatives Rash    Other Reaction(s): Not available    HOME MEDICATIONS: Outpatient Medications Prior to Visit  Medication Sig Dispense Refill   acetaminophen (TYLENOL) 650 MG suppository Place 650 mg rectally every 4 (four) hours as needed.     Azelaic Acid 15 % gel Apply 1 Application topically 2 (two) times daily.     calcium carbonate (TUMS - DOSED IN MG ELEMENTAL CALCIUM) 500 MG chewable tablet Chew 1 tablet by mouth in the morning, at noon, in the evening, and at bedtime.     cetirizine (ZYRTEC) 10 MG tablet Take 10 mg by mouth daily.     Cholecalciferol (VITAMIN D) 50 MCG (2000 UT) CAPS Take 2,000 Units by mouth daily.     denosumab (PROLIA) 60 MG/ML SOSY injection Inject 60 mg into the skin every 6 (six) months.     doxycycline (ADOXA) 100 MG tablet Take 100 mg by mouth 2 (two) times daily.     fluticasone (FLONASE) 50 MCG/ACT nasal spray Place 2 sprays into both nostrils daily.      lactobacillus acidophilus (BACID) TABS tablet Take 1 tablet by mouth daily.     meclizine (ANTIVERT) 12.5 MG tablet Take 1 tablet (12.5 mg total) by mouth 3 (three) times daily as needed for dizziness (every 8 hpurs as needed for dizziness). 90 tablet 1   metroNIDAZOLE (METROGEL) 0.75 %  gel Apply 1 Application topically 2 (two) times daily.     Multiple Vitamin (MULTIVITAMIN WITH MINERALS) TABS tablet Take 1 tablet by mouth daily.     No facility-administered medications prior to visit.    PAST MEDICAL HISTORY: Past Medical History:  Diagnosis Date   Arthritis    Asthma    Headache    hx of migraines   Peripheral vascular disease (HCC)    vein crushed in left leg several years ago not currently a problem     PAST SURGICAL HISTORY: Past Surgical History:  Procedure Laterality Date   cesearean section      x 2    right hand  surgery      to remove cyst    right knee arthroscopy      TONSILLECTOMY     TOTAL KNEE ARTHROPLASTY Right 09/25/2014   Procedure: RIGHT TOTAL KNEE ARTHROPLASTY LEFT KNEE INJECTION;  Surgeon: Loanne Drilling, MD;  Location: [facility];  Service: Orthopedics;  Laterality: Right;   TOTAL KNEE ARTHROPLASTY Left 09/27/2018   Procedure: TOTAL KNEE ARTHROPLASTY;  Surgeon: Ollen Gross, MD;  Location: [facility];  Service: Orthopedics;  Laterality: Left;    WISDOM TOOTH EXTRACTION      FAMILY HISTORY: History reviewed. No pertinent family history.  SOCIAL HISTORY: Social History   Socioeconomic History   Marital status: Married    Spouse name: Not on file   Number of children: 2   Years of education: Not on file   Highest education level: Not on file  Occupational History   Not on file  Tobacco Use   Smoking status: Never   Smokeless tobacco: Never  Vaping Use   Vaping status: Never Used  Substance and Sexual Activity   Alcohol use: Not Currently    Comment: rare   Drug use: No   Sexual activity: Yes  Other Topics Concern   Not on file  Social History Narrative   Not on file   Social Determinants of Health   Financial Resource Strain: Not on file  Food Insecurity: Not on file  Transportation Needs: Not on file  Physical Activity: Not on file  Stress: Not on file  Social Connections: Not on file  Intimate Partner Violence: Not on file     PHYSICAL EXAM  GENERAL EXAM/CONSTITUTIONAL: Vitals:  Vitals:   06/17/23 1345  Resp: 15  SpO2: 96%  Height: 5\' 2"  (1.575 m)    Body mass index is 30.4 kg/m. Wt Readings from Last 3 Encounters:  12/16/22 166 lb 3.2 oz (75.4 kg)  09/27/18 182 lb 15.7 oz (83 kg)  09/22/18 183 lb (83 kg)   Patient is in no distress; well developed, nourished and groomed; neck is supple  MUSCULOSKELETAL: Gait, strength, tone, movements noted in Neurologic exam below  NEUROLOGIC: MENTAL STATUS:      No data to display         awake,  alert, oriented to person, place and time recent and remote memory intact normal attention and concentration language fluent, comprehension intact, naming intact fund of knowledge appropriate  CRANIAL NERVE:  2nd, 3rd, 4th, 6th - Visual fields full to confrontation, extraocular muscles intact, no nystagmus 5th - facial sensation symmetric 7th - facial strength symmetric 8th - hearing intact 9th - palate elevates symmetrically, uvula midline 11th - shoulder shrug symmetric 12th - tongue protrusion midline  MOTOR:  normal bulk and tone, full strength in the BUE, BLE  SENSORY:  normal and symmetric to light  touch  COORDINATION:  Presence of mild action tremors   GAIT/STATION:  Normal, unable to tandem and positive romberg     DIAGNOSTIC DATA (LABS, IMAGING, TESTING) - I reviewed patient records, labs, notes, testing and imaging myself where available.  Lab Results  Component Value Date   WBC 12.5 (H) 09/28/2018   HGB 11.9 (L) 09/28/2018   HCT 37.9 09/28/2018   MCV 94.3 09/28/2018   PLT 193 09/28/2018      Component Value Date/Time   NA 140 09/28/2018 0526   K 4.7 09/28/2018 0526   CL 108 09/28/2018 0526   CO2 25 09/28/2018 0526   GLUCOSE 127 (H) 09/28/2018 0526   BUN 13 09/28/2018 0526   CREATININE 0.68 09/28/2018 0526   CALCIUM 8.6 (L) 09/28/2018 0526   PROT 7.3 09/22/2018 1102   ALBUMIN 4.4 09/22/2018 1102   AST 31 09/22/2018 1102   ALT 43 09/22/2018 1102   ALKPHOS 99 09/22/2018 1102   BILITOT 0.9 09/22/2018 1102   GFRNONAA >60 09/28/2018 0526   GFRAA >60 09/28/2018 0526   Lab Results  Component Value Date   CHOL 199 10/30/2009   HDL 47.30 10/30/2009   LDLCALC 124 (H) 10/30/2009   LDLDIRECT 111.1 10/28/2006   TRIG 138.0 10/30/2009   CHOLHDL 4 10/30/2009   No results found for: "HGBA1C" No results found for: "VITAMINB12" Lab Results  Component Value Date   TSH 0.78 10/30/2009   MRI Brain 02/09/2023 Small number of T2/FLAIR hyperintense foci in  the subcortical and deep white matter consistent with minimal chronic microvascular ischemic change, typical for age.  None of these appear to be acute. Increased susceptibility involving the left basal ganglia (series 10 images 38-45).  This is consistent with remote hemorrhage.  No discrete stroke is noted on other sequences. No acute findings. The internal auditory canals and the mastoid air cells appear normal.   ASSESSMENT AND PLAN  72 y.o. year old female with history of dizziness, migraine headaches, asthma who is presenting for follow up for her ongoing dizziness for the past couple years.  MRI negative for any acute intracranial abnormality.  Will refer patient to physical therapy.  When it comes to her tremor they are manageable.  Also advised patient to increase her water intake.  Advised her to continue following up with PCP and return as needed.   1. Dizziness   2. Dysequilibrium   3. Essential tremor      Patient Instructions  Continue current medications Referral to vestibular therapy Increase your water intake, at least 48 hours daily Please contact us if your tremor get worse and you would like to start medication Continue to follow with PCP and return as needed.  No orders of the defined types were placed in this encounter.   No orders of the defined types were placed in this encounter.   Return if symptoms worsen or fail to improve.   [name], MD 06/17/2023, 5:05 PM  [facility] [address] [place], [place] [address] [phone]

## 2023-06-18 ENCOUNTER — Telehealth: Payer: Self-pay | Admitting: Neurology

## 2023-06-18 NOTE — Telephone Encounter (Signed)
Referral for physical therapy refaxed to [facility] due to patient was not called to schedule appt. Phone: [phone], Fax: [phone]

## 2023-06-22 ENCOUNTER — Ambulatory Visit: Payer: Medicare Other | Attending: Neurology | Admitting: Physical Therapy

## 2023-06-22 ENCOUNTER — Encounter: Payer: Self-pay | Admitting: Physical Therapy

## 2023-06-22 DIAGNOSIS — R2681 Unsteadiness on feet: Secondary | ICD-10-CM | POA: Insufficient documentation

## 2023-06-22 DIAGNOSIS — H8113 Benign paroxysmal vertigo, bilateral: Secondary | ICD-10-CM | POA: Insufficient documentation

## 2023-06-22 DIAGNOSIS — R42 Dizziness and giddiness: Secondary | ICD-10-CM | POA: Insufficient documentation

## 2023-06-22 NOTE — Therapy (Signed)
OUTPATIENT PHYSICAL THERAPY VESTIBULAR EVALUATION     Patient Name: Makayla Garcia MRN: [id number] DOB:12-27-1950, 72 y.o., female Today's Date: 06/22/2023  END OF SESSION:  PT End of Session - 06/22/23 1835     Visit Number 1    Number of Visits 9    Date for PT Re-Evaluation 07/24/23   pushed out 1 week due to pt going out of town   Authorization Type [employer] Medicare    Authorization Time Period 06-22-23 - 08-22-23    PT Start Time 1018    PT Stop Time 1105    PT Time Calculation (min) 47 min    Activity Tolerance Patient tolerated treatment well    Behavior During Therapy WFL for tasks assessed/performed             Past Medical History:  Diagnosis Date   Arthritis    Asthma    Headache    hx of migraines   Peripheral vascular disease (HCC)    vein crushed in left leg several years ago not currently a problem    Past Surgical History:  Procedure Laterality Date   cesearean section      x 2    right hand surgery      to remove cyst    right knee arthroscopy      TONSILLECTOMY     TOTAL KNEE ARTHROPLASTY Right 09/25/2014   Procedure: RIGHT TOTAL KNEE ARTHROPLASTY LEFT KNEE INJECTION;  Surgeon: Loanne Drilling, MD;  Location: [facility];  Service: Orthopedics;  Laterality: Right;   TOTAL KNEE ARTHROPLASTY Left 09/27/2018   Procedure: TOTAL KNEE ARTHROPLASTY;  Surgeon: Ollen Gross, MD;  Location: [facility];  Service: Orthopedics;  Laterality: Left;    WISDOM TOOTH EXTRACTION     Patient Active Problem List   Diagnosis Date Noted   Dizziness after extension of neck 10/22/2021   Presbycusis of both ears 10/22/2021   Aftercare 12/13/2018   History of total knee replacement, left 12/13/2018   Osteoarthritis of left knee 09/25/2014   Gastroesophageal reflux disease 10/30/2009   KNEE PAIN 10/30/2009   Pain in lower limb 10/30/2009   PLANTAR FASCIITIS 05/25/2008   Disorder of skeletal system 12/01/2007   Herpes simplex 11/23/2006   Allergic rhinitis 11/23/2006    Asthma 11/23/2006   Fibrocystic disease of breast 11/23/2006    PCP: Merri Brunette, MD REFERRING PROVIDER: [name], MD  REFERRING DIAG:  Diagnosis  R42 (ICD-10-CM) - Dizziness  R42 (ICD-10-CM) - Dysequilibrium    THERAPY DIAG:  BPPV (benign paroxysmal positional vertigo), bilateral  Unsteadiness on feet  ONSET DATE: Feb. 2024 - Referral date 02-14-23  Rationale for Evaluation and Treatment: Rehabilitation  SUBJECTIVE:   SUBJECTIVE STATEMENT: Pt reports she has had vertigo for approx. 2 years but says the episodes have become more frequent; sometimes it goes away and sometimes it doesn't - feels tired and sleepy; pt states she takes Meclizine 2x/day -  has been taking it since she saw Dr. Teresa Coombs Pt accompanied by: self  PERTINENT HISTORY: h/o Lt TKA, presbycusis of both ears, h/o vertigo x 2 yrs  PAIN:  Are you having pain? No  PRECAUTIONS: None  RED FLAGS: None   WEIGHT BEARING RESTRICTIONS: No  FALLS: Has patient fallen in last 6 months? No  LIVING ENVIRONMENT: Lives with: lives with their spouse Lives in: House/apartment  PLOF: Independent  PATIENT GOALS: resolve the vertigo  OBJECTIVE:  Note: Objective measures were completed at Evaluation unless otherwise noted.  DIAGNOSTIC FINDINGS:  IMPRESSION: This MRI of the brain without contrast shows the following: Small number of T2/FLAIR hyperintense foci in the subcortical and deep white matter consistent with minimal chronic microvascular ischemic change, typical for age.  None of these appear to be acute. Increased susceptibility involving the left basal ganglia (series 10 images 38-45).  This is consistent with remote hemorrhage.  No discrete stroke is noted on other sequences. No acute findings. The internal auditory canals and the mastoid air cells appear normal.  COGNITION: Overall cognitive status: Within functional limits for tasks assessed  POSTURE:  rounded shoulders and forward  head  Cervical ROM:  WFL's   BED MOBILITY:  Independent  GAIT: Gait pattern: WFL Distance walked: 58' Assistive device utilized: None  PATIENT SURVEYS:  FOTO DPS 54  VESTIBULAR ASSESSMENT:  GENERAL OBSERVATION: pt ambulating independently but mildy unsteady due to c/o vertigo; pt reports episodes have gotten more frequent in occurrence   SYMPTOM BEHAVIOR:  Subjective history: pt reports intermittent episodes of vertigo for past 2 yrs;  pt was referred to this clinic in Feb. 2024 for vestibular rehab; the PT felt further testing from MD was needed; imaging was done which was unremarkable; pt was referred back to PT in June by Dr. Teresa Coombs  Non-Vestibular symptoms:  N/A  Type of dizziness: Spinning/Vertigo and Unsteady with head/body turns  Frequency: daily  Duration: varies - secs to minutes  Aggravating factors: Induced by position change: lying supine, rolling to the right, and rolling to the left  Relieving factors: head stationary  Progression of symptoms: unchanged   POSITIONAL TESTING: Right Dix-Hallpike: upbeating, right nystagmus Left Dix-Hallpike: upbeating, left nystagmus Left Roll Test: geotropic nystagmus  MOTION SENSITIVITY:  Motion Sensitivity Quotient Intensity: 0 = none, 1 = Lightheaded, 2 = Mild, 3 = Moderate, 4 = Severe, 5 = Vomiting  Intensity  1. Sitting to supine   2. Supine to L side   3. Supine to R side   4. Supine to sitting   5. L Hallpike-Dix 4  6. Up from L  3  7. R Hallpike-Dix 3  8. Up from R  3  9. Sitting, head tipped to L knee   10. Head up from L knee   11. Sitting, head tipped to R knee   12. Head up from R knee   13. Sitting head turns x5   14.Sitting head nods x5   15. In stance, 180 turn to L    16. In stance, 180 turn to R    VESTIBULAR TREATMENT:                                                                                                   DATE: 06-22-23  Canalith Repositioning:  Epley Left: Number of Reps: 2,  Response to Treatment: comment: horizontal nystagmus noted in initial position of 2nd rep, and Comment: pt c/o nausea  PATIENT EDUCATION: Education details: etiology of BPPV; article from [employer] on BPPV given to pt Person educated: Patient Education method: Explanation and Handouts Education comprehension: verbalized understanding  HOME EXERCISE PROGRAM:  to be established  GOALS: Goals reviewed with patient? Yes     LONG TERM GOALS: Target date: 07-24-23  Pt will have (-) Lt and Rt Dix-Hallpike tests with no nystagmus and no c/o vertigo in either test position.  Baseline:  Goal status: INITIAL  2.  Pt will perform bed mobility without c/o vertigo. Baseline:  Goal status: INITIAL  3.  Improve FOTO score from DPS 54 to >/= 61/100 to demo improvement in vertigo.  Baseline:  Goal status: INITIAL  4.  Independent in HEP for habituation and self treatment prn should BPPV re-occur.  Baseline:  Goal status: INITIAL   ASSESSMENT:  CLINICAL IMPRESSION: Patient is a 72 y.o. lady who was seen today for physical therapy evaluation and treatment for vertigo due to multi-canal BPPV.  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  Pt was treated for Lt BPPV with 2 reps of Epley with no significant improvement in symptoms on 2nd rep.  Will continue to assess and treat as indicated.   OBJECTIVE IMPAIRMENTS: decreased balance, difficulty walking, and dizziness.   ACTIVITY LIMITATIONS: bending, squatting, bed mobility, reach over head, and locomotion level  PARTICIPATION LIMITATIONS: meal prep, cleaning, laundry, driving, shopping, and community activity  PERSONAL FACTORS: Past/current experiences and Time since onset of injury/illness/exacerbation are also affecting patient's functional outcome.   REHAB POTENTIAL: Good  CLINICAL DECISION MAKING:  Evolving/moderate complexity  EVALUATION COMPLEXITY: Moderate   PLAN:  PT FREQUENCY: 2x/week  PT DURATION: 4 weeks  PLANNED INTERVENTIONS: 97110-Therapeutic exercises, 97530- Therapeutic activity, [id number]- Neuromuscular re-education, [phone]- Self Care, [id number]- Gait training, [phone]- Canalith repositioning, Patient/Family education, and Vestibular training  PLAN FOR NEXT SESSION: recheck Lt Dix-Hallpike and Lt roll test   Katora Fini, Donavan Burnet, PT 06/22/2023, 6:40 PM

## 2023-06-23 DIAGNOSIS — H5203 Hypermetropia, bilateral: Secondary | ICD-10-CM | POA: Diagnosis not present

## 2023-06-25 ENCOUNTER — Encounter: Payer: Self-pay | Admitting: Physical Therapy

## 2023-06-25 ENCOUNTER — Ambulatory Visit: Payer: Self-pay | Admitting: Physical Therapy

## 2023-06-25 DIAGNOSIS — H8113 Benign paroxysmal vertigo, bilateral: Secondary | ICD-10-CM

## 2023-06-25 DIAGNOSIS — R42 Dizziness and giddiness: Secondary | ICD-10-CM | POA: Diagnosis not present

## 2023-06-25 DIAGNOSIS — R2681 Unsteadiness on feet: Secondary | ICD-10-CM | POA: Diagnosis not present

## 2023-06-25 NOTE — Therapy (Signed)
OUTPATIENT PHYSICAL THERAPY VESTIBULAR TREATMENT NOTE     Patient Name: Makayla Garcia MRN: [id number] DOB:1951-02-11, 72 y.o., female Today's Date: 06/25/2023  END OF SESSION:  PT End of Session - 06/25/23 0843     Visit Number 2    Number of Visits 9    Date for PT Re-Evaluation 07/24/23   pushed out 1 week due to pt going out of town   Authorization Type [employer] Medicare    Authorization Time Period 06-22-23 - 08-22-23    PT Start Time 0802    PT Stop Time 0840    PT Time Calculation (min) 38 min    Activity Tolerance Patient tolerated treatment well    Behavior During Therapy WFL for tasks assessed/performed              Past Medical History:  Diagnosis Date   Arthritis    Asthma    Headache    hx of migraines   Peripheral vascular disease (HCC)    vein crushed in left leg several years ago not currently a problem    Past Surgical History:  Procedure Laterality Date   cesearean section      x 2    right hand surgery      to remove cyst    right knee arthroscopy      TONSILLECTOMY     TOTAL KNEE ARTHROPLASTY Right 09/25/2014   Procedure: RIGHT TOTAL KNEE ARTHROPLASTY LEFT KNEE INJECTION;  Surgeon: Loanne Drilling, MD;  Location: [facility];  Service: Orthopedics;  Laterality: Right;   TOTAL KNEE ARTHROPLASTY Left 09/27/2018   Procedure: TOTAL KNEE ARTHROPLASTY;  Surgeon: Ollen Gross, MD;  Location: [facility];  Service: Orthopedics;  Laterality: Left;    WISDOM TOOTH EXTRACTION     Patient Active Problem List   Diagnosis Date Noted   Dizziness after extension of neck 10/22/2021   Presbycusis of both ears 10/22/2021   Aftercare 12/13/2018   History of total knee replacement, left 12/13/2018   Osteoarthritis of left knee 09/25/2014   Gastroesophageal reflux disease 10/30/2009   KNEE PAIN 10/30/2009   Pain in lower limb 10/30/2009   PLANTAR FASCIITIS 05/25/2008   Disorder of skeletal system 12/01/2007   Herpes simplex 11/23/2006   Allergic rhinitis  11/23/2006   Asthma 11/23/2006   Fibrocystic disease of breast 11/23/2006    PCP: Merri Brunette, MD REFERRING PROVIDER: [name], MD  REFERRING DIAG:  Diagnosis  R42 (ICD-10-CM) - Dizziness  R42 (ICD-10-CM) - Dysequilibrium    THERAPY DIAG:  BPPV (benign paroxysmal positional vertigo), bilateral  ONSET DATE: Feb. 2024 - Referral date 02-14-23  Rationale for Evaluation and Treatment: Rehabilitation  SUBJECTIVE:   SUBJECTIVE STATEMENT: Pt reports she has been very dizzy since eval and treatment on Monday - has had minimal nausea but a lot of dizziness; husband reports she has been walking "like a mummy" around the house - wide based gait and arms out.  Pt reports she has been rolling repetitively at home as instructed Pt accompanied by: self  PERTINENT HISTORY: h/o Lt TKA, presbycusis of both ears, h/o vertigo x 2 yrs  PAIN:  Are you having pain? Headache yesterday - allergies are "very keen" right now - may contribute to the HA- no HA now  PRECAUTIONS: None  RED FLAGS: None   WEIGHT BEARING RESTRICTIONS: No  FALLS: Has patient fallen in last 6 months? No  LIVING ENVIRONMENT: Lives with: lives with their spouse Lives in: House/apartment  PLOF: Independent  PATIENT GOALS: resolve the vertigo  OBJECTIVE:  Note: Objective measures were completed at Evaluation unless otherwise noted.  DIAGNOSTIC FINDINGS: IMPRESSION: This MRI of the brain without contrast shows the following: Small number of T2/FLAIR hyperintense foci in the subcortical and deep white matter consistent with minimal chronic microvascular ischemic change, typical for age.  None of these appear to be acute. Increased susceptibility involving the left basal ganglia (series 10 images 38-45).  This is consistent with remote hemorrhage.  No discrete stroke is noted on other sequences. No acute findings. The internal auditory canals and the mastoid air cells appear normal.  COGNITION: Overall  cognitive status: Within functional limits for tasks assessed  POSTURE:  rounded shoulders and forward head  Cervical ROM:  WFL's   BED MOBILITY:  Independent  GAIT: Gait pattern: WFL Distance walked: 16' Assistive device utilized: None  PATIENT SURVEYS:  FOTO DPS 54  VESTIBULAR ASSESSMENT:  GENERAL OBSERVATION: pt ambulating independently but mildy unsteady due to c/o vertigo; pt reports episodes have gotten more frequent in occurrence   SYMPTOM BEHAVIOR:  Subjective history: pt reports intermittent episodes of vertigo for past 2 yrs;  pt was referred to this clinic in Feb. 2024 for vestibular rehab; the PT felt further testing from MD was needed; imaging was done which was unremarkable; pt was referred back to PT in June by Dr. Teresa Coombs  Non-Vestibular symptoms:  N/A  Type of dizziness: Spinning/Vertigo and Unsteady with head/body turns  Frequency: daily  Duration: varies - secs to minutes  Aggravating factors: Induced by position change: lying supine, rolling to the right, and rolling to the left  Relieving factors: head stationary  Progression of symptoms: unchanged   POSITIONAL TESTING: Right Dix-Hallpike: upbeating, right nystagmus Left Dix-Hallpike: upbeating, left nystagmus Left Roll Test: geotropic nystagmus  MOTION SENSITIVITY:  Motion Sensitivity Quotient Intensity: 0 = none, 1 = Lightheaded, 2 = Mild, 3 = Moderate, 4 = Severe, 5 = Vomiting  Intensity  1. Sitting to supine   2. Supine to L side   3. Supine to R side   4. Supine to sitting   5. L Hallpike-Dix 4  6. Up from L  3  7. R Hallpike-Dix 3  8. Up from R  3  9. Sitting, head tipped to L knee   10. Head up from L knee   11. Sitting, head tipped to R knee   12. Head up from R knee   13. Sitting head turns x5   14.Sitting head nods x5   15. In stance, 180 turn to L    16. In stance, 180 turn to R    VESTIBULAR TREATMENT:                                                                                                    DATE: 06-25-23  Canalith Repositioning:  Epley Left: Number of Reps: 4, Response to Treatment: comment: no nystagmus noted, and Comment: very minimal nausea reported in today's session   NeuroRe-ed:   Lt Dix-Hallpike test (-) with no nystagmus noted with slight c/o vertigo Rt  Dix-Hallpike test (+) with Rt rotary upbeating nystagmus Horizontal roll test (-) for Rt and Lt sides - Lt horizontal canalithiasis BPPV resolved   Self Care:  instructed pt and husband in Epley maneuver for self treatment of BPPV prn - see pt instructions; also instructed in Brandt-Daroff exercises  Self Treatment for Right Posterior / Anterior Canalithiasis    Sitting on bed: 1. Turn head 45 right. (a) Lie back slowly, shoulders on pillow, head on bed. (b) Hold _20-30___ seconds. 2. Keeping head on bed, turn head 90 left. Hold _20-30___ seconds. 3. Roll to left, head on 45 angle down toward bed. Hold _20-30___ seconds. 4. Sit up on left side of bed. Repeat __3__ times per session. Do _2___ sessions per day.    PATIENT EDUCATION: Education details: Teaching laboratory technician - both written instructions & diagram of maneuver, Brandt-Daroff exercise prn for habituation Person educated: Patient, husband Education method: Explanation and Handouts Education comprehension: verbalized understanding  HOME EXERCISE PROGRAM:  to be established  GOALS: Goals reviewed with patient? Yes     LONG TERM GOALS: Target date: 07-24-23  Pt will have (-) Lt and Rt Dix-Hallpike tests with no nystagmus and no c/o vertigo in either test position.  Baseline:  Goal status: INITIAL  2.  Pt will perform bed mobility without c/o vertigo. Baseline:  Goal status: INITIAL  3.  Improve FOTO score from DPS 54 to >/= 61/100 to demo improvement in vertigo.  Baseline:  Goal status: INITIAL  4.  Independent in HEP for habituation and self treatment prn should BPPV re-occur.  Baseline:  Goal status:  INITIAL   ASSESSMENT:  CLINICAL IMPRESSION: Patient had (-) Lt Dix-Hallpike test and (-) Lt horizontal roll test indicative of resolution of Lt posterior and horizontal canalithiasis.  Pt did have (+) Rt Dix-Hallpike test, indicative of Rt posterior canalithiasis.  Pt was treated with 4 reps of Epley maneuver with significant improvement on 4th rep with no nystagmus noted.  Pt continued to c/o dizziness/light-headedness with return to upright position which appeared to be related to change in BP rather than true vertigo.  Pt much improved at end of session compared to status at start of session.  Cont with POC.  OBJECTIVE IMPAIRMENTS: decreased balance, difficulty walking, and dizziness.   ACTIVITY LIMITATIONS: bending, squatting, bed mobility, reach over head, and locomotion level  PARTICIPATION LIMITATIONS: meal prep, cleaning, laundry, driving, shopping, and community activity  PERSONAL FACTORS: Past/current experiences and Time since onset of injury/illness/exacerbation are also affecting patient's functional outcome.   REHAB POTENTIAL: Good  CLINICAL DECISION MAKING: Evolving/moderate complexity  EVALUATION COMPLEXITY: Moderate   PLAN:  PT FREQUENCY: 2x/week  PT DURATION: 4 weeks  PLANNED INTERVENTIONS: 97110-Therapeutic exercises, 97530- Therapeutic activity, [id number]- Neuromuscular re-education, [phone]- Self Care, [id number]- Gait training, [phone]- Canalith repositioning, Patient/Family education, and Vestibular training  PLAN FOR NEXT SESSION: recheck Rt Dix-Hallpike   Rakeb Kibble, Donavan Burnet, PT 06/25/2023, 8:46 AM

## 2023-06-25 NOTE — Patient Instructions (Signed)
Sit to Side-Lying - lie down on RIGHT side first    Sit on edge of bed. 1. Turn head 45 to right. 2. Maintain head position and lie down slowly on left side. Hold until symptoms subside. 3. Sit up slowly. Hold until symptoms subside. 4. Turn head 45 to left. 5. Maintain head position and lie down slowly on right side. Hold until symptoms subside. 6. Sit up slowly. Repeat sequence ___5_ times per session. Do __3-5__ sessions per day.  Copyright  VHI. All rights reserved.    How to Perform the Epley Maneuver The Epley maneuver is an exercise that relieves symptoms of vertigo. Vertigo is the feeling that you or your surroundings are moving when they are not. When you feel vertigo, you may feel like the room is spinning and may have trouble walking. The Epley maneuver is used for a type of vertigo caused by a calcium deposit in a part of the inner ear. The maneuver involves changing head positions to help the deposit move out of the area. You can do this maneuver at home whenever you have symptoms of vertigo. You can repeat it in 24 hours if your vertigo has not gone away. Even though the Epley maneuver may relieve your vertigo for a few weeks, it is possible that your symptoms will return. This maneuver relieves vertigo, but it does not relieve dizziness. What are the risks? If it is done correctly, the Epley maneuver is considered safe. Sometimes it can lead to dizziness or nausea that goes away after a short time. If you develop other symptoms--such as changes in vision, weakness, or numbness--stop doing the maneuver and call your health care provider. Supplies needed: A bed or table. A pillow. How to do the Epley maneuver     Sit on the edge of a bed or table with your back straight and your legs extended or hanging over the edge of the bed or table. Turn your head halfway toward the affected ear or side as told by your health care provider. Lie backward quickly with your head turned  until you are lying flat on your back. Your head should dangle (head-hanging position). You may want to position a pillow under your shoulders. Hold this position for at least 30 seconds. If you feel dizzy or have symptoms of vertigo, continue to hold the position until the symptoms stop. Turn your head to the opposite direction until your unaffected ear is facing down. Your head should continue to dangle. Hold this position for at least 30 seconds. If you feel dizzy or have symptoms of vertigo, continue to hold the position until the symptoms stop. Turn your whole body to the same side as your head so that you are positioned on your side. Your head will now be nearly facedown and no longer needs to dangle. Hold for at least 30 seconds. If you feel dizzy or have symptoms of vertigo, continue to hold the position until the symptoms stop. Sit back up. You can repeat the maneuver in 24 hours if your vertigo does not go away. Follow these instructions at home: For 24 hours after doing the Epley maneuver: Keep your head in an upright position. When lying down to sleep or rest, keep your head raised (elevated) with two or more pillows. Avoid excessive neck movements. Activity Do not drive or use machinery if you feel dizzy. After doing the Epley maneuver, return to your normal activities as told by your health care provider. Ask your health  care provider what activities are safe for you. General instructions Drink enough fluid to keep your urine pale yellow. Do not drink alcohol. Take over-the-counter and prescription medicines only as told by your health care provider. Keep all follow-up visits. This is important. Preventing vertigo symptoms Ask your health care provider if there is anything you should do at home to prevent vertigo. He or she may recommend that you: Keep your head elevated with two or more pillows while you sleep. Do not sleep on the side of your affected ear. Get up slowly from  bed. Avoid sudden movements during the day. Avoid extreme head positions or movement, such as looking up or bending over. Contact a health care provider if: Your vertigo gets worse. You have other symptoms, including: Nausea. Vomiting. Headache. Get help right away if you: Have vision changes. Have a headache or neck pain that is severe or getting worse. Cannot stop vomiting. Have new numbness or weakness in any part of your body. These symptoms may represent a serious problem that is an emergency. Do not wait to see if the symptoms will go away. Get medical help right away. Call your local emergency services (911 in the [place] Do not drive yourself to the hospital. Summary Vertigo is the feeling that you or your surroundings are moving when they are not. The Epley maneuver is an exercise that relieves symptoms of vertigo. If the Epley maneuver is done correctly, it is considered safe. This information is not intended to replace advice given to you by your health care provider. Make sure you discuss any questions you have with your health care provider. Document Revised: 07/11/2020 Document Reviewed: 07/11/2020 Elsevier Patient Education  2024 [employer]    Self Treatment for Right Posterior / Anterior Canalithiasis    Sitting on bed: 1. Turn head 45 right. (a) Lie back slowly, shoulders on pillow, head on bed. (b) Hold _20-30___ seconds. 2. Keeping head on bed, turn head 90 left. Hold _20-30___ seconds. 3. Roll to left, head on 45 angle down toward bed. Hold _20-30___ seconds. 4. Sit up on left side of bed. Repeat __3__ times per session. Do _2___ sessions per day.  Copyright  VHI. All rights reserved.

## 2023-06-29 ENCOUNTER — Ambulatory Visit: Payer: Medicare Other | Attending: Family Medicine | Admitting: Physical Therapy

## 2023-06-29 ENCOUNTER — Encounter: Payer: Self-pay | Admitting: Physical Therapy

## 2023-06-29 DIAGNOSIS — H8113 Benign paroxysmal vertigo, bilateral: Secondary | ICD-10-CM | POA: Insufficient documentation

## 2023-06-29 NOTE — Therapy (Signed)
OUTPATIENT PHYSICAL THERAPY VESTIBULAR TREATMENT NOTE     Patient Name: Makayla Garcia MRN: [id number] DOB:[birth year], 72 y.o., female Today's Date: 06/29/2023  END OF SESSION:  PT End of Session - 06/29/23 0959     Visit Number 3    Number of Visits 9    Date for PT Re-Evaluation 07/24/23   pushed out 1 week due to pt going out of town   Authorization Type [employer] Medicare    Authorization Time Period 06-22-23 - 08-22-23    PT Start Time 0850    PT Stop Time 0922    PT Time Calculation (min) 32 min    Activity Tolerance Patient tolerated treatment well    Behavior During Therapy [facility] for tasks assessed/performed               Past Medical History:  Diagnosis Date   Arthritis    Asthma    Headache    hx of migraines   Peripheral vascular disease (HCC)    vein crushed in left leg several years ago not currently a problem    Past Surgical History:  Procedure Laterality Date   cesearean section      x 2    right hand surgery      to remove cyst    right knee arthroscopy      TONSILLECTOMY     TOTAL KNEE ARTHROPLASTY Right 09/25/2014   Procedure: RIGHT TOTAL KNEE ARTHROPLASTY LEFT KNEE INJECTION;  Surgeon: Loanne Drilling, MD;  Location: [facility];  Service: Orthopedics;  Laterality: Right;   TOTAL KNEE ARTHROPLASTY Left 09/27/2018   Procedure: TOTAL KNEE ARTHROPLASTY;  Surgeon: Ollen Gross, MD;  Location: [facility];  Service: Orthopedics;  Laterality: Left;    WISDOM TOOTH EXTRACTION     Patient Active Problem List   Diagnosis Date Noted   Dizziness after extension of neck 10/22/2021   Presbycusis of both ears 10/22/2021   Aftercare 12/13/2018   History of total knee replacement, left 12/13/2018   Osteoarthritis of left knee 09/25/2014   Gastroesophageal reflux disease 10/30/2009   KNEE PAIN 10/30/2009   Pain in lower limb 10/30/2009   PLANTAR FASCIITIS 05/25/2008   Disorder of skeletal system 12/01/2007   Herpes simplex 11/23/2006   Allergic rhinitis  11/23/2006   Asthma 11/23/2006   Fibrocystic disease of breast 11/23/2006    PCP: Merri Brunette, MD REFERRING PROVIDER: [name], MD  REFERRING DIAG:  Diagnosis  R42 (ICD-10-CM) - Dizziness  R42 (ICD-10-CM) - Dysequilibrium    THERAPY DIAG:  BPPV (benign paroxysmal positional vertigo), bilateral  ONSET DATE: Feb. 2024 - Referral date 02-14-23  Rationale for Evaluation and Treatment: Rehabilitation  SUBJECTIVE:   SUBJECTIVE STATEMENT: Pt reports she is much better but still gets dizzy when she looks up; says it is not nearly as bad as it was  Pt accompanied by: self  PERTINENT HISTORY: h/o Lt TKA, presbycusis of both ears, h/o vertigo x 2 yrs  PAIN:  Are you having pain? Headache yesterday - allergies are "very keen" right now - may contribute to the HA- no HA now  PRECAUTIONS: None  RED FLAGS: None   WEIGHT BEARING RESTRICTIONS: No  FALLS: Has patient fallen in last 6 months? No  LIVING ENVIRONMENT: Lives with: lives with their spouse Lives in: House/apartment  PLOF: Independent  PATIENT GOALS: resolve the vertigo  OBJECTIVE:  Note: Objective measures were completed at Evaluation unless otherwise noted.  DIAGNOSTIC FINDINGS: IMPRESSION: This MRI of the brain without  contrast shows the following: Small number of T2/FLAIR hyperintense foci in the subcortical and deep white matter consistent with minimal chronic microvascular ischemic change, typical for age.  None of these appear to be acute. Increased susceptibility involving the left basal ganglia (series 10 images 38-45).  This is consistent with remote hemorrhage.  No discrete stroke is noted on other sequences. No acute findings. The internal auditory canals and the mastoid air cells appear normal.  COGNITION: Overall cognitive status: Within functional limits for tasks assessed  POSTURE:  rounded shoulders and forward head  Cervical ROM:  WFL's   BED MOBILITY:  Independent  GAIT: Gait  pattern: WFL Distance walked: 36' Assistive device utilized: None  PATIENT SURVEYS:  FOTO DPS 54  VESTIBULAR ASSESSMENT:  GENERAL OBSERVATION: pt ambulating independently but mildy unsteady due to c/o vertigo; pt reports episodes have gotten more frequent in occurrence   SYMPTOM BEHAVIOR:  Subjective history: pt reports intermittent episodes of vertigo for past 2 yrs;  pt was referred to this clinic in Feb. 2024 for vestibular rehab; the PT felt further testing from MD was needed; imaging was done which was unremarkable; pt was referred back to PT in June by Dr. Teresa Coombs  Non-Vestibular symptoms:  N/A  Type of dizziness: Spinning/Vertigo and Unsteady with head/body turns  Frequency: daily  Duration: varies - secs to minutes  Aggravating factors: Induced by position change: lying supine, rolling to the right, and rolling to the left  Relieving factors: head stationary  Progression of symptoms: unchanged   POSITIONAL TESTING: Right Dix-Hallpike: upbeating, right nystagmus Left Dix-Hallpike: upbeating, left nystagmus Left Roll Test: geotropic nystagmus  MOTION SENSITIVITY:  Motion Sensitivity Quotient Intensity: 0 = none, 1 = Lightheaded, 2 = Mild, 3 = Moderate, 4 = Severe, 5 = Vomiting  Intensity  1. Sitting to supine   2. Supine to L side   3. Supine to R side   4. Supine to sitting   5. L Hallpike-Dix 4  6. Up from L  3  7. R Hallpike-Dix 3  8. Up from R  3  9. Sitting, head tipped to L knee   10. Head up from L knee   11. Sitting, head tipped to R knee   12. Head up from R knee   13. Sitting head turns x5   14.Sitting head nods x5   15. In stance, 180 turn to L    16. In stance, 180 turn to R    VESTIBULAR TREATMENT:                                                                                                   DATE: 06-29-23  NeuroRe-ed:    Rt Dix-Hallpike test (-) with no nystagmus and no c/o vertigo Horizontal roll tests (-) for Rt and Lt sides - no nystagmus  noted  Rt sidelying test (-) with no c/o vertigo and no nystagmus noted Lt Sidelying test (+) for c/o vertigo and nystagmus Lt Dix-Hallpike test (+) with no nystagmus noted with slight c/o vertigo  Canalith Repositioning: Epley Left: Number of Reps: 2,  Response to Treatment: comment: no nystagmus noted on 2nd rep in any position, and Comment: no nausea reported  Pt Education (as instructed in session on 06-25-23) - reviewed with pt  Self Treatment for Right Posterior / Anterior Canalithiasis    Sitting on bed: 1. Turn head 45 right. (a) Lie back slowly, shoulders on pillow, head on bed. (b) Hold _20-30___ seconds. 2. Keeping head on bed, turn head 90 left. Hold _20-30___ seconds. 3. Roll to left, head on 45 angle down toward bed. Hold _20-30___ seconds. 4. Sit up on left side of bed. Repeat __3__ times per session. Do _2___ sessions per day.    PATIENT EDUCATION: Education details: Teaching laboratory technician - both written instructions & diagram of maneuver, Brandt-Daroff exercise prn for habituation Person educated: Patient, husband Education method: Explanation and Handouts Education comprehension: verbalized understanding  HOME EXERCISE PROGRAM:  to be established  GOALS: Goals reviewed with patient? Yes     LONG TERM GOALS: Target date: 07-24-23  Pt will have (-) Lt and Rt Dix-Hallpike tests with no nystagmus and no c/o vertigo in either test position.  Baseline:  Goal status: INITIAL  2.  Pt will perform bed mobility without c/o vertigo. Baseline:  Goal status: INITIAL  3.  Improve FOTO score from DPS 54 to >/= 61/100 to demo improvement in vertigo.  Baseline:  Goal status: INITIAL  4.  Independent in HEP for habituation and self treatment prn should BPPV re-occur.  Baseline:  Goal status: INITIAL   ASSESSMENT:  CLINICAL IMPRESSION: Patient had (-) Rt Dix-Hallpike test with no c/o vertigo and no nystagmus in test position, indicative of resolution of Rt BPPV.  Pt  did have (+) Lt sidelying test and then (+) Lt Dix-Hallpike test with Lt rotary upbeating nystagmus and c/o vertigo with approx. 4-5 beats in initial position of Epley maneuver.  No nystagmus and no c/o vertigo in any position of 2nd rep of Epley, indicative of resolution of Lt BPPV posterior canalithiasis.   Cont with POC.  OBJECTIVE IMPAIRMENTS: decreased balance, difficulty walking, and dizziness.   ACTIVITY LIMITATIONS: bending, squatting, bed mobility, reach over head, and locomotion level  PARTICIPATION LIMITATIONS: meal prep, cleaning, laundry, driving, shopping, and community activity  PERSONAL FACTORS: Past/current experiences and Time since onset of injury/illness/exacerbation are also affecting patient's functional outcome.   REHAB POTENTIAL: Good  CLINICAL DECISION MAKING: Evolving/moderate complexity  EVALUATION COMPLEXITY: Moderate   PLAN:  PT FREQUENCY: 2x/week  PT DURATION: 4 weeks  PLANNED INTERVENTIONS: 97110-Therapeutic exercises, 97530- Therapeutic activity, 97112- Neuromuscular re-education, 97535- Self Care, [id number]- Gait training, [phone]- Canalith repositioning, Patient/Family education, and Vestibular training  PLAN FOR NEXT SESSION: recheck Lt BPPV and treat prn (pt initially had Lt posterior and horizontal canalithiasis, then Rt posterior canalithiasis and now re-occurrence of Lt posterior canalithiasis noted in today's session)   Nami Strawder, Donavan Burnet, PT 06/29/2023, 10:01 AM

## 2023-07-01 ENCOUNTER — Ambulatory Visit: Payer: Medicare Other

## 2023-07-09 ENCOUNTER — Encounter: Payer: Self-pay | Admitting: Physical Therapy

## 2023-07-09 ENCOUNTER — Ambulatory Visit: Payer: Medicare Other | Admitting: Physical Therapy

## 2023-07-09 DIAGNOSIS — H8113 Benign paroxysmal vertigo, bilateral: Secondary | ICD-10-CM | POA: Diagnosis not present

## 2023-07-09 NOTE — Therapy (Signed)
OUTPATIENT PHYSICAL THERAPY VESTIBULAR TREATMENT NOTE     Patient Name: Makayla Garcia MRN: [id number] DOB:01/03/1951, 72 y.o., female Today's Date: 07/09/2023  END OF SESSION:  PT End of Session - 07/09/23 0831     Visit Number 4    Number of Visits 9    Date for PT Re-Evaluation 07/24/23   pushed out 1 week due to pt going out of town   Authorization Type [employer] Medicare    Authorization Time Period 06-22-23 - 08-22-23    PT Start Time 0756    PT Stop Time 0832    PT Time Calculation (min) 36 min    Activity Tolerance Patient tolerated treatment well    Behavior During Therapy [facility] for tasks assessed/performed                Past Medical History:  Diagnosis Date   Arthritis    Asthma    Headache    hx of migraines   Peripheral vascular disease (HCC)    vein crushed in left leg several years ago not currently a problem    Past Surgical History:  Procedure Laterality Date   cesearean section      x 2    right hand surgery      to remove cyst    right knee arthroscopy      TONSILLECTOMY     TOTAL KNEE ARTHROPLASTY Right 09/25/2014   Procedure: RIGHT TOTAL KNEE ARTHROPLASTY LEFT KNEE INJECTION;  Surgeon: Loanne Drilling, MD;  Location: [facility];  Service: Orthopedics;  Laterality: Right;   TOTAL KNEE ARTHROPLASTY Left 09/27/2018   Procedure: TOTAL KNEE ARTHROPLASTY;  Surgeon: Ollen Gross, MD;  Location: [facility];  Service: Orthopedics;  Laterality: Left;    WISDOM TOOTH EXTRACTION     Patient Active Problem List   Diagnosis Date Noted   Dizziness after extension of neck 10/22/2021   Presbycusis of both ears 10/22/2021   Aftercare 12/13/2018   History of total knee replacement, left 12/13/2018   Osteoarthritis of left knee 09/25/2014   Gastroesophageal reflux disease 10/30/2009   KNEE PAIN 10/30/2009   Pain in lower limb 10/30/2009   PLANTAR FASCIITIS 05/25/2008   Disorder of skeletal system 12/01/2007   Herpes simplex 11/23/2006   Allergic rhinitis  11/23/2006   Asthma 11/23/2006   Fibrocystic disease of breast 11/23/2006    PCP: Merri Brunette, MD REFERRING PROVIDER: [name], MD  REFERRING DIAG:  Diagnosis  R42 (ICD-10-CM) - Dizziness  R42 (ICD-10-CM) - Dysequilibrium    THERAPY DIAG:  BPPV (benign paroxysmal positional vertigo), bilateral  ONSET DATE: Feb. 2024 - Referral date 02-14-23  Rationale for Evaluation and Treatment: Rehabilitation  SUBJECTIVE:   SUBJECTIVE STATEMENT: Pt reports dizziness is much better but is not completely gone; went to beach last week with family and got dizzy one time when she was watching the waves;  also got dizzy when she did a quick turn in the kitchen.  Has done the Epley a couple of times since previous treatment; has not taken Meclizine since Tuesday Pt accompanied by: self  PERTINENT HISTORY: h/o Lt TKA, presbycusis of both ears, h/o vertigo x 2 yrs  PAIN:  Are you having pain? Headache yesterday - allergies are "very keen" right now - may contribute to the HA- no HA now  PRECAUTIONS: None  RED FLAGS: None   WEIGHT BEARING RESTRICTIONS: No  FALLS: Has patient fallen in last 6 months? No  LIVING ENVIRONMENT: Lives with: lives with their  spouse Lives in: House/apartment  PLOF: Independent  PATIENT GOALS: resolve the vertigo  OBJECTIVE:  Note: Objective measures were completed at Evaluation unless otherwise noted.  DIAGNOSTIC FINDINGS: IMPRESSION: This MRI of the brain without contrast shows the following: Small number of T2/FLAIR hyperintense foci in the subcortical and deep white matter consistent with minimal chronic microvascular ischemic change, typical for age.  None of these appear to be acute. Increased susceptibility involving the left basal ganglia (series 10 images 38-45).  This is consistent with remote hemorrhage.  No discrete stroke is noted on other sequences. No acute findings. The internal auditory canals and the mastoid air cells appear  normal.  COGNITION: Overall cognitive status: Within functional limits for tasks assessed  POSTURE:  rounded shoulders and forward head  Cervical ROM:  WFL's   BED MOBILITY:  Independent  GAIT: Gait pattern: WFL Distance walked: 30' Assistive device utilized: None  PATIENT SURVEYS:  FOTO DPS 54  VESTIBULAR ASSESSMENT:  GENERAL OBSERVATION: pt ambulating independently but mildy unsteady due to c/o vertigo; pt reports episodes have gotten more frequent in occurrence   SYMPTOM BEHAVIOR:  Subjective history: pt reports intermittent episodes of vertigo for past 2 yrs;  pt was referred to this clinic in Feb. 2024 for vestibular rehab; the PT felt further testing from MD was needed; imaging was done which was unremarkable; pt was referred back to PT in June by Dr. Teresa Coombs  Non-Vestibular symptoms:  N/A  Type of dizziness: Spinning/Vertigo and Unsteady with head/body turns  Frequency: daily  Duration: varies - secs to minutes  Aggravating factors: Induced by position change: lying supine, rolling to the right, and rolling to the left  Relieving factors: head stationary  Progression of symptoms: unchanged   POSITIONAL TESTING: Right Dix-Hallpike: upbeating, right nystagmus Left Dix-Hallpike: upbeating, left nystagmus Left Roll Test: geotropic nystagmus  MOTION SENSITIVITY:  Motion Sensitivity Quotient Intensity: 0 = none, 1 = Lightheaded, 2 = Mild, 3 = Moderate, 4 = Severe, 5 = Vomiting  Intensity  1. Sitting to supine   2. Supine to L side   3. Supine to R side   4. Supine to sitting   5. L Hallpike-Dix 4  6. Up from L  3  7. R Hallpike-Dix 3  8. Up from R  3  9. Sitting, head tipped to L knee   10. Head up from L knee   11. Sitting, head tipped to R knee   12. Head up from R knee   13. Sitting head turns x5   14.Sitting head nods x5   15. In stance, 180 turn to L    16. In stance, 180 turn to R    VESTIBULAR TREATMENT:                                                                                                    DATE: 07-09-23  NeuroRe-ed:    Lt Dix-Hallpike test (+) with Lt rotary upbeating nystagmus approx. 8 sec duration and moderate c/o vertigo  Lt Horizontal roll tests (-) for nystagmus but pt reported mild dizziness (completed  after 3 reps Epley maneuver for Lt posterior canalithiasis);  Rt horizontal roll test (-) for nystagmus & c/o vertigo   Canalith Repositioning: Epley Left: Number of Reps: 3, Response to Treatment: comment: no nystagmus noted on 3rd rep in any position:  Comment:  Lt horizontal nystagmus occurred on 2nd rep of Epley in initial position - pt reported severe vertigo and also onset of nausea; pt was given Ginger ale to help nausea subside - was able to tolerate continued treatment including 3rd rep of Epley and then testing of Rt & Lt horizontal roll tests   Reviewed self treatment; repetitive rolling for horizontal canalithiasis and Epley or Brandt-Daroff exercises for posterior canalithiasis (based on husband's availability to assist her); pt verbalized understanding of all treatments    Pt Education (as instructed in session on 06-25-23) - SEE ABOVE   Self Treatment for Right Posterior / Anterior Canalithiasis    Sitting on bed: 1. Turn head 45 right. (a) Lie back slowly, shoulders on pillow, head on bed. (b) Hold _20-30___ seconds. 2. Keeping head on bed, turn head 90 left. Hold _20-30___ seconds. 3. Roll to left, head on 45 angle down toward bed. Hold _20-30___ seconds. 4. Sit up on left side of bed. Repeat __3__ times per session. Do _2___ sessions per day.    PATIENT EDUCATION: Education details: Teaching laboratory technician - both written instructions & diagram of maneuver, Brandt-Daroff exercise prn for habituation Person educated: Patient, husband Education method: Explanation and Handouts Education comprehension: verbalized understanding  HOME EXERCISE PROGRAM:  to be established  GOALS: Goals reviewed with  patient? Yes     LONG TERM GOALS: Target date: 07-24-23  Pt will have (-) Lt and Rt Dix-Hallpike tests with no nystagmus and no c/o vertigo in either test position.  Baseline:  Goal status: INITIAL  2.  Pt will perform bed mobility without c/o vertigo. Baseline:  Goal status: INITIAL  3.  Improve FOTO score from DPS 54 to >/= 61/100 to demo improvement in vertigo.  Baseline:  Goal status: INITIAL  4.  Independent in HEP for habituation and self treatment prn should BPPV re-occur.  Baseline:  Goal status: INITIAL   ASSESSMENT:  CLINICAL IMPRESSION: Patient had (+) Lt Dix-Hallpike test with Lt rotary upbeating nystagmus, indicative of Lt posterior canalithiasis;  horizontal nystagmus occurred in initial position of 2nd rep of Epley, indicative of crystals being repositioned in Lt horizontal canal.  Pt able to tolerate 3rd rep of Epley and no nystagmus/minimal c/o dizziness reported in initial position only - much improvement on this rep compared to symptoms provoked in reps 1 and 2.  Pt verbalized understanding of Epley, repetitive rolling and Brandt-Daroff exercises for self treatment prn.  Cont with POC.  OBJECTIVE IMPAIRMENTS: decreased balance, difficulty walking, and dizziness.   ACTIVITY LIMITATIONS: bending, squatting, bed mobility, reach over head, and locomotion level  PARTICIPATION LIMITATIONS: meal prep, cleaning, laundry, driving, shopping, and community activity  PERSONAL FACTORS: Past/current experiences and Time since onset of injury/illness/exacerbation are also affecting patient's functional outcome.   REHAB POTENTIAL: Good  CLINICAL DECISION MAKING: Evolving/moderate complexity  EVALUATION COMPLEXITY: Moderate   PLAN:  PT FREQUENCY: 2x/week  PT DURATION: 4 weeks  PLANNED INTERVENTIONS: 97110-Therapeutic exercises, 97530- Therapeutic activity, [id number]- Neuromuscular re-education, 97535- Self Care, 16109- Gait training, [phone]- Canalith repositioning,  Patient/Family education, and Vestibular training  PLAN FOR NEXT SESSION: recheck Lt BPPV and treat prn - posterior and horizontal canals;  (pt initially had Lt posterior and horizontal canalithiasis, then Rt posterior  canalithiasis and now re-occurrence of Lt posterior canalithiasis)   Layna Roeper, Donavan Burnet, PT 07/09/2023, 8:33 AM

## 2023-07-13 ENCOUNTER — Encounter: Payer: Self-pay | Admitting: Physical Therapy

## 2023-07-13 ENCOUNTER — Ambulatory Visit: Payer: Medicare Other | Admitting: Physical Therapy

## 2023-07-13 VITALS — BP 146/94

## 2023-07-13 DIAGNOSIS — H8113 Benign paroxysmal vertigo, bilateral: Secondary | ICD-10-CM | POA: Diagnosis not present

## 2023-07-13 NOTE — Therapy (Signed)
OUTPATIENT PHYSICAL THERAPY VESTIBULAR TREATMENT NOTE     Patient Name: Makayla Garcia MRN: [id number] DOB:12-08-1950, 72 y.o., female Today's Date: 07/13/2023  END OF SESSION:       Past Medical History:  Diagnosis Date   Arthritis    Asthma    Headache    hx of migraines   Peripheral vascular disease (HCC)    vein crushed in left leg several years ago not currently a problem    Past Surgical History:  Procedure Laterality Date   cesearean section      x 2    right hand surgery      to remove cyst    right knee arthroscopy      TONSILLECTOMY     TOTAL KNEE ARTHROPLASTY Right 09/25/2014   Procedure: RIGHT TOTAL KNEE ARTHROPLASTY LEFT KNEE INJECTION;  Surgeon: Loanne Drilling, MD;  Location: [facility];  Service: Orthopedics;  Laterality: Right;   TOTAL KNEE ARTHROPLASTY Left 09/27/2018   Procedure: TOTAL KNEE ARTHROPLASTY;  Surgeon: Ollen Gross, MD;  Location: [facility];  Service: Orthopedics;  Laterality: Left;    WISDOM TOOTH EXTRACTION     Patient Active Problem List   Diagnosis Date Noted   Dizziness after extension of neck 10/22/2021   Presbycusis of both ears 10/22/2021   Aftercare 12/13/2018   History of total knee replacement, left 12/13/2018   Osteoarthritis of left knee 09/25/2014   Gastroesophageal reflux disease 10/30/2009   KNEE PAIN 10/30/2009   Pain in lower limb 10/30/2009   PLANTAR FASCIITIS 05/25/2008   Disorder of skeletal system 12/01/2007   Herpes simplex 11/23/2006   Allergic rhinitis 11/23/2006   Asthma 11/23/2006   Fibrocystic disease of breast 11/23/2006    PCP: Merri Brunette, MD REFERRING PROVIDER: [name], MD  REFERRING DIAG:  Diagnosis  R42 (ICD-10-CM) - Dizziness  R42 (ICD-10-CM) - Dysequilibrium    THERAPY DIAG:  No diagnosis found.  ONSET DATE: Feb. 2024 - Referral date 02-14-23  Rationale for Evaluation and Treatment: Rehabilitation  SUBJECTIVE:   SUBJECTIVE STATEMENT: Pt reports she had 2 bad days  regarding the dizziness - Saturday and Sunday - has a headache this morning; did Brandt-Daroff exercises yesterday and that seemed to settle it.  Pt reports Friday was a good day with no dizziness occurring Pt accompanied by: self  PERTINENT HISTORY: h/o Lt TKA, presbycusis of both ears, h/o vertigo x 2 yrs  PAIN:  Are you having pain? Headache - moderate intensity - was worse when she first got up this morning  PRECAUTIONS: None  RED FLAGS: None   WEIGHT BEARING RESTRICTIONS: No  FALLS: Has patient fallen in last 6 months? No  LIVING ENVIRONMENT: Lives with: lives with their spouse Lives in: House/apartment  PLOF: Independent  PATIENT GOALS: resolve the vertigo  OBJECTIVE:  Note: Objective measures were completed at Evaluation unless otherwise noted.  DIAGNOSTIC FINDINGS: IMPRESSION: This MRI of the brain without contrast shows the following: Small number of T2/FLAIR hyperintense foci in the subcortical and deep white matter consistent with minimal chronic microvascular ischemic change, typical for age.  None of these appear to be acute. Increased susceptibility involving the left basal ganglia (series 10 images 38-45).  This is consistent with remote hemorrhage.  No discrete stroke is noted on other sequences. No acute findings. The internal auditory canals and the mastoid air cells appear normal.  COGNITION: Overall cognitive status: Within functional limits for tasks assessed  POSTURE:  rounded shoulders and forward head  Cervical ROM:  WFL's   BED MOBILITY:  Independent  GAIT: Gait pattern: WFL Distance walked: 67' Assistive device utilized: None  PATIENT SURVEYS:  FOTO DPS 54  VESTIBULAR ASSESSMENT:  GENERAL OBSERVATION: pt ambulating independently but mildy unsteady due to c/o vertigo; pt reports episodes have gotten more frequent in occurrence   SYMPTOM BEHAVIOR:  Subjective history: pt reports intermittent episodes of vertigo for past 2 yrs;  pt was  referred to this clinic in Feb. 2024 for vestibular rehab; the PT felt further testing from MD was needed; imaging was done which was unremarkable; pt was referred back to PT in June by Dr. Teresa Coombs  Non-Vestibular symptoms:  N/A  Type of dizziness: Spinning/Vertigo and Unsteady with head/body turns  Frequency: daily  Duration: varies - secs to minutes  Aggravating factors: Induced by position change: lying supine, rolling to the right, and rolling to the left  Relieving factors: head stationary  Progression of symptoms: unchanged   POSITIONAL TESTING: Right Dix-Hallpike: upbeating, right nystagmus Left Dix-Hallpike: upbeating, left nystagmus Left Roll Test: geotropic nystagmus  MOTION SENSITIVITY:  Motion Sensitivity Quotient Intensity: 0 = none, 1 = Lightheaded, 2 = Mild, 3 = Moderate, 4 = Severe, 5 = Vomiting  Intensity  1. Sitting to supine   2. Supine to L side   3. Supine to R side   4. Supine to sitting   5. L Hallpike-Dix 4  6. Up from L  3  7. R Hallpike-Dix 3  8. Up from R  3  9. Sitting, head tipped to L knee   10. Head up from L knee   11. Sitting, head tipped to R knee   12. Head up from R knee   13. Sitting head turns x5   14.Sitting head nods x5   15. In stance, 180 turn to L    16. In stance, 180 turn to R    VESTIBULAR TREATMENT:                                                                                                   DATE: 07-13-23   Vitals:   07/13/23 0812  BP: (!) 146/94    NeuroRe-ed:    Lt Dix-Hallpike test (-) with no nystagmus and no c/o vertigo Rt Dix-Hallpike test (-) with no nystagmus and no c/o vertigo Lt and Rt Horizontal roll tests (-) for nystagmus and no c/o vertigo in either test position  Pt reported light-headedness with return to upright seated position from both Rt and Lt Dix-Hallpike test positions - subsided after approx. 10 secs (each side)  Pt instructed to recheck BP later on today to see if it has decreased;  recommended pt to continue to stay well hydrated    Pt Education (as instructed in session on 06-25-23) - SEE ABOVE   Self Treatment for Right Posterior / Anterior Canalithiasis    Sitting on bed: 1. Turn head 45 right. (a) Lie back slowly, shoulders on pillow, head on bed. (b) Hold _20-30___ seconds. 2. Keeping head on bed, turn head 90 left. Hold _20-30___ seconds. 3. Roll to left,  head on 45 angle down toward bed. Hold _20-30___ seconds. 4. Sit up on left side of bed. Repeat __3__ times per session. Do _2___ sessions per day.    PATIENT EDUCATION: Education details: Teaching laboratory technician - both written instructions & diagram of maneuver, Brandt-Daroff exercise prn for habituation Person educated: Patient, husband Education method: Explanation and Handouts Education comprehension: verbalized understanding  HOME EXERCISE PROGRAM:  to be established  GOALS: Goals reviewed with patient? Yes     LONG TERM GOALS: Target date: 07-24-23  Pt will have (-) Lt and Rt Dix-Hallpike tests with no nystagmus and no c/o vertigo in either test position.  Baseline:  Goal status: INITIAL  2.  Pt will perform bed mobility without c/o vertigo. Baseline:  Goal status: INITIAL  3.  Improve FOTO score from DPS 54 to >/= 61/100 to demo improvement in vertigo.  Baseline:  Goal status: INITIAL  4.  Independent in HEP for habituation and self treatment prn should BPPV re-occur.  Baseline:  Goal status: INITIAL   ASSESSMENT:  CLINICAL IMPRESSION: All positional testing (-) in today's session with no c/o true spinning vertigo with either Rt or Lt Dix-Hallpike test and with Rt and Lt horizontal roll test.  Pt's BP was elevated with pt reporting having a headache at start of session and pt also reported light-headedness with return to upright seated position from each Dix-Hallpike test.  BPPV appears to be resolved; symptoms appear to possibly be related to elevated BP.  Cont with POC.  OBJECTIVE  IMPAIRMENTS: decreased balance, difficulty walking, and dizziness.   ACTIVITY LIMITATIONS: bending, squatting, bed mobility, reach over head, and locomotion level  PARTICIPATION LIMITATIONS: meal prep, cleaning, laundry, driving, shopping, and community activity  PERSONAL FACTORS: Past/current experiences and Time since onset of injury/illness/exacerbation are also affecting patient's functional outcome.   REHAB POTENTIAL: Good  CLINICAL DECISION MAKING: Evolving/moderate complexity  EVALUATION COMPLEXITY: Moderate   PLAN:  PT FREQUENCY: 2x/week  PT DURATION: 4 weeks  PLANNED INTERVENTIONS: 97110-Therapeutic exercises, 97530- Therapeutic activity, [id number]- Neuromuscular re-education, 97535- Self Care, [id number]- Gait training, [phone]- Canalith repositioning, Patient/Family education, and Vestibular training  PLAN FOR NEXT SESSION: recheck Lt BPPV and treat prn - posterior and horizontal canals;  (pt initially had Lt posterior and horizontal canalithiasis, then Rt posterior canalithiasis and now re-occurrence of Lt posterior canalithiasis)   Vershawn Westrup, Donavan Burnet, PT 07/13/2023, 7:59 AM

## 2023-08-06 ENCOUNTER — Encounter: Payer: Self-pay | Admitting: Physical Therapy

## 2023-08-06 ENCOUNTER — Ambulatory Visit: Payer: Medicare Other | Attending: Family Medicine | Admitting: Physical Therapy

## 2023-08-06 DIAGNOSIS — H8113 Benign paroxysmal vertigo, bilateral: Secondary | ICD-10-CM | POA: Insufficient documentation

## 2023-08-06 DIAGNOSIS — K080 Exfoliation of teeth due to systemic causes: Secondary | ICD-10-CM | POA: Diagnosis not present

## 2023-08-06 DIAGNOSIS — H8112 Benign paroxysmal vertigo, left ear: Secondary | ICD-10-CM | POA: Insufficient documentation
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
## 2023-08-25 ENCOUNTER — Encounter: Payer: Self-pay | Admitting: Physical Therapy

## 2023-08-25 ENCOUNTER — Ambulatory Visit: Payer: Self-pay | Admitting: Physical Therapy

## 2023-08-25 DIAGNOSIS — H8113 Benign paroxysmal vertigo, bilateral: Secondary | ICD-10-CM | POA: Diagnosis not present

## 2023-08-25 DIAGNOSIS — H8112 Benign paroxysmal vertigo, left ear: Secondary | ICD-10-CM
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
## 2023-08-26 NOTE — Addendum Note (Signed)
 Addended by: Althea Charon on: 08/26/2023 07:25 PM   Modules accepted: Orders

## 2023-09-03 ENCOUNTER — Ambulatory Visit: Payer: Medicare Other | Attending: Family Medicine | Admitting: Physical Therapy

## 2023-09-03 DIAGNOSIS — H8113 Benign paroxysmal vertigo, bilateral: Secondary | ICD-10-CM | POA: Diagnosis not present

## 2023-09-03 NOTE — Therapy (Signed)
 OUTPATIENT PHYSICAL THERAPY VESTIBULAR TREATMENT NOTE/RE-CERT     Patient Name: Makayla Garcia MRN: [id number] DOB:1951-02-20, 73 y.o., female Today's Date: 09/04/2023  END OF SESSION:  PT End of Session - 09/04/23 1510     Visit Number 8    Number of Visits 9    Date for PT Re-Evaluation 07/24/23   pushed out 1 week due to pt going out of town   Authorization Type [employer] Medicare    Authorization Time Period 06-22-23 - 08-22-23    PT Start Time 1450    PT Stop Time 1531    PT Time Calculation (min) 41 min    Activity Tolerance Patient tolerated treatment well    Behavior During Therapy WFL for tasks assessed/performed                   Past Medical History:  Diagnosis Date   Arthritis    Asthma    Headache    hx of migraines   Peripheral vascular disease (HCC)    vein crushed in left leg several years ago not currently a problem    Past Surgical History:  Procedure Laterality Date   cesearean section      x 2    right hand surgery      to remove cyst    right knee arthroscopy      TONSILLECTOMY     TOTAL KNEE ARTHROPLASTY Right 09/25/2014   Procedure: RIGHT TOTAL KNEE ARTHROPLASTY LEFT KNEE INJECTION;  Surgeon: Dempsey Melodi GAILS, MD;  Location: [facility];  Service: Orthopedics;  Laterality: Right;   TOTAL KNEE ARTHROPLASTY Left 09/27/2018   Procedure: TOTAL KNEE ARTHROPLASTY;  Surgeon: Melodi Dempsey, MD;  Location: [facility];  Service: Orthopedics;  Laterality: Left;    WISDOM TOOTH EXTRACTION     Patient Active Problem List   Diagnosis Date Noted   Dizziness after extension of neck 10/22/2021   Presbycusis of both ears 10/22/2021   Aftercare 12/13/2018   History of total knee replacement, left 12/13/2018   Osteoarthritis of left knee 09/25/2014   Gastroesophageal reflux disease 10/30/2009   KNEE PAIN 10/30/2009   Pain in lower limb 10/30/2009   PLANTAR FASCIITIS 05/25/2008   Disorder of skeletal system 12/01/2007   Herpes simplex 11/23/2006   Allergic  rhinitis 11/23/2006   Asthma 11/23/2006   Fibrocystic disease of breast 11/23/2006    PCP: Claudene Pellet, MD REFERRING PROVIDER: Gregg Lek, MD  REFERRING DIAG:  Diagnosis  R42 (ICD-10-CM) - Dizziness  R42 (ICD-10-CM) - Dysequilibrium    THERAPY DIAG:  BPPV (benign paroxysmal positional vertigo), bilateral  ONSET DATE: Feb. 2024 - Referral date 02-14-23  Rationale for Evaluation and Treatment: Rehabilitation  SUBJECTIVE:   SUBJECTIVE STATEMENT: Pt reports the dizziness is much improved but not completely gone - felt it some when she was walking her dog and turning her head  Pt accompanied by: self  PERTINENT HISTORY: h/o Lt TKA, presbycusis of both ears, h/o vertigo x 2 yrs  PAIN:  Are you having pain? Headache - moderate intensity - was worse when she first got up this morning  PRECAUTIONS: None  RED FLAGS: None   WEIGHT BEARING RESTRICTIONS: No  FALLS: Has patient fallen in last 6 months? No  LIVING ENVIRONMENT: Lives with: lives with their spouse Lives in: House/apartment  PLOF: Independent  PATIENT GOALS: resolve the vertigo  OBJECTIVE:  Note: Objective measures were completed at Evaluation unless otherwise noted.  DIAGNOSTIC FINDINGS: IMPRESSION: This MRI of the brain  without contrast shows the following: Small number of T2/FLAIR hyperintense foci in the subcortical and deep white matter consistent with minimal chronic microvascular ischemic change, typical for age.  None of these appear to be acute. Increased susceptibility involving the left basal ganglia (series 10 images 38-45).  This is consistent with remote hemorrhage.  No discrete stroke is noted on other sequences. No acute findings. The internal auditory canals and the mastoid air cells appear normal.  COGNITION: Overall cognitive status: Within functional limits for tasks assessed  POSTURE:  rounded shoulders and forward head  Cervical ROM:  WFL's   BED MOBILITY:   Independent  GAIT: Gait pattern: WFL Distance walked: 75' Assistive device utilized: None  PATIENT SURVEYS:  FOTO DPS 54  VESTIBULAR ASSESSMENT:  GENERAL OBSERVATION: pt ambulating independently but mildy unsteady due to c/o vertigo; pt reports episodes have gotten more frequent in occurrence   SYMPTOM BEHAVIOR:  Subjective history: pt reports intermittent episodes of vertigo for past 2 yrs;  pt was referred to this clinic in Feb. 2024 for vestibular rehab; the PT felt further testing from MD was needed; imaging was done which was unremarkable; pt was referred back to PT in June by Dr. Gregg  Non-Vestibular symptoms:  N/A  Type of dizziness: Spinning/Vertigo and Unsteady with head/body turns  Frequency: daily  Duration: varies - secs to minutes  Aggravating factors: Induced by position change: lying supine, rolling to the right, and rolling to the left  Relieving factors: head stationary  Progression of symptoms: unchanged   POSITIONAL TESTING: Right Dix-Hallpike: upbeating, right nystagmus Left Dix-Hallpike: upbeating, left nystagmus Left Roll Test: geotropic nystagmus  MOTION SENSITIVITY:  Motion Sensitivity Quotient Intensity: 0 = none, 1 = Lightheaded, 2 = Mild, 3 = Moderate, 4 = Severe, 5 = Vomiting  Intensity  1. Sitting to supine   2. Supine to L side   3. Supine to R side   4. Supine to sitting   5. L Hallpike-Dix 4  6. Up from L  3  7. R Hallpike-Dix 3  8. Up from R  3  9. Sitting, head tipped to L knee   10. Head up from L knee   11. Sitting, head tipped to R knee   12. Head up from R knee   13. Sitting head turns x5   14.Sitting head nods x5   15. In stance, 180 turn to L    16. In stance, 180 turn to R    VESTIBULAR TREATMENT:                                                                                                   DATE: 09-03-23      NeuroRe-ed:    Lt Dix-Hallpike test (-) with no nystagmus and no c/o vertigo in test position; pt did  report dizziness with return to upright sitting position  Rt Dix-Hallpike test (+) with Rt rotary upbeating nystagmus and c/o vertigo in test position - indicative of Rt BPPV posterior canalithiasis   Canalith Repositioning:  Epley maneuver for Rt BPPV - 3 reps performed; symptoms  much improved on 3rd rep  Reviewed Epley maneuver for self treatment prn (previously issued)     Pt Education (as instructed in session on 06-25-23) - SEE ABOVE   Self Treatment for Right Posterior / Anterior Canalithiasis    Sitting on bed: 1. Turn head 45 right. (a) Lie back slowly, shoulders on pillow, head on bed. (b) Hold _20-30___ seconds. 2. Keeping head on bed, turn head 90 left. Hold _20-30___ seconds. 3. Roll to left, head on 45 angle down toward bed. Hold _20-30___ seconds. 4. Sit up on left side of bed. Repeat __3__ times per session. Do _2___ sessions per day.    PATIENT EDUCATION: Education details: Teaching laboratory technician - both written instructions & diagram of maneuver, Brandt-Daroff exercise prn for habituation Person educated: Patient, husband Education method: Explanation and Handouts Education comprehension: verbalized understanding  HOME EXERCISE PROGRAM:  to be established  GOALS: Goals reviewed with patient? Yes     LONG TERM GOALS: Target date: 07-24-23;  New Target Date - 09-18-23  Pt will have (-) Lt and Rt Dix-Hallpike tests with no nystagmus and no c/o vertigo in either test position.  Baseline: (+) Lt Dix-Hallpike test on 08-25-23 Goal status: Ongoing  2.  Pt will perform bed mobility without c/o vertigo. Baseline:  Goal status: Ongoing  3.  Improve FOTO score from DPS 54 to >/= 61/100 to demo improvement in vertigo.  Baseline:  Goal status: Ongoing  4.  Independent in HEP for habituation and self treatment prn should BPPV re-occur.  Baseline:  Goal status: Goal met 08-06-23   ASSESSMENT:  CLINICAL IMPRESSION: Pt had (+) Rt Dix-Hallpike test with Rt upbeating  rotary nystagmus and c/o vertigo in test position, indicative of Rt BPPV.  Lt BPPV resolved as Lt Dix-Hallpike test (-) with no c/o vertigo in test position.  Symptoms much improved on 3rd rep of Epley.  Will continue to assess and treat prn. Cont with POC.  OBJECTIVE IMPAIRMENTS: decreased balance, difficulty walking, and dizziness.   ACTIVITY LIMITATIONS: bending, squatting, bed mobility, reach over head, and locomotion level  PARTICIPATION LIMITATIONS: meal prep, cleaning, laundry, driving, shopping, and community activity  PERSONAL FACTORS: Past/current experiences and Time since onset of injury/illness/exacerbation are also affecting patient's functional outcome.   REHAB POTENTIAL: Good  CLINICAL DECISION MAKING: Evolving/moderate complexity  EVALUATION COMPLEXITY: Moderate   PLAN:  PT FREQUENCY: 2x/week  PT DURATION: 4 weeks  PLANNED INTERVENTIONS: 97110-Therapeutic exercises, 97530- Therapeutic activity, [id number]- Neuromuscular re-education, [phone]- Self Care, [id number]- Gait training, [phone]- Canalith repositioning, Patient/Family education, and Vestibular training  PLAN FOR NEXT SESSION: recheck Rt BPPV and treat prn   Romello Hoehn, [place], PT 09/04/2023, 3:12 PM

## 2023-09-04 ENCOUNTER — Encounter: Payer: Self-pay | Admitting: Physical Therapy

## 2023-09-15 ENCOUNTER — Ambulatory Visit: Payer: Medicare Other | Admitting: Physical Therapy

## 2023-09-15 DIAGNOSIS — H8113 Benign paroxysmal vertigo, bilateral: Secondary | ICD-10-CM | POA: Diagnosis not present

## 2023-09-15 NOTE — Therapy (Unsigned)
OUTPATIENT PHYSICAL THERAPY VESTIBULAR TREATMENT NOTE/RE-CERT     Patient Name: Makayla Garcia MRN: [id number] DOB:1951/05/10, 73 y.o., female Today's Date: 09/15/2023  END OF SESSION:          Past Medical History:  Diagnosis Date   Arthritis    Asthma    Headache    hx of migraines   Peripheral vascular disease (HCC)    vein crushed in left leg several years ago not currently a problem    Past Surgical History:  Procedure Laterality Date   cesearean section      x 2    right hand surgery      to remove cyst    right knee arthroscopy      TONSILLECTOMY     TOTAL KNEE ARTHROPLASTY Right 09/25/2014   Procedure: RIGHT TOTAL KNEE ARTHROPLASTY LEFT KNEE INJECTION;  Surgeon: Loanne Drilling, MD;  Location: [facility];  Service: Orthopedics;  Laterality: Right;   TOTAL KNEE ARTHROPLASTY Left 09/27/2018   Procedure: TOTAL KNEE ARTHROPLASTY;  Surgeon: Ollen Gross, MD;  Location: [facility];  Service: Orthopedics;  Laterality: Left;    WISDOM TOOTH EXTRACTION     Patient Active Problem List   Diagnosis Date Noted   Dizziness after extension of neck 10/22/2021   Presbycusis of both ears 10/22/2021   Aftercare 12/13/2018   History of total knee replacement, left 12/13/2018   Osteoarthritis of left knee 09/25/2014   Gastroesophageal reflux disease 10/30/2009   KNEE PAIN 10/30/2009   Pain in lower limb 10/30/2009   PLANTAR FASCIITIS 05/25/2008   Disorder of skeletal system 12/01/2007   Herpes simplex 11/23/2006   Allergic rhinitis 11/23/2006   Asthma 11/23/2006   Fibrocystic disease of breast 11/23/2006    PCP: Merri Brunette, MD REFERRING PROVIDER: [name], MD  REFERRING DIAG:  Diagnosis  R42 (ICD-10-CM) - Dizziness  R42 (ICD-10-CM) - Dysequilibrium    THERAPY DIAG:  No diagnosis found.  ONSET DATE: Feb. 2024 - Referral date 02-14-23  Rationale for Evaluation and Treatment: Rehabilitation  SUBJECTIVE:   SUBJECTIVE STATEMENT: Pt reports the dizziness  is much improved but not completely gone - felt it some when she was walking her dog and turning her head  Pt accompanied by: self  PERTINENT HISTORY: h/o Lt TKA, presbycusis of both ears, h/o vertigo x 2 yrs  PAIN:  Are you having pain? Headache - moderate intensity - was worse when she first got up this morning  PRECAUTIONS: None  RED FLAGS: None   WEIGHT BEARING RESTRICTIONS: No  FALLS: Has patient fallen in last 6 months? No  LIVING ENVIRONMENT: Lives with: lives with their spouse Lives in: House/apartment  PLOF: Independent  PATIENT GOALS: resolve the vertigo  OBJECTIVE:  Note: Objective measures were completed at Evaluation unless otherwise noted.  DIAGNOSTIC FINDINGS: IMPRESSION: This MRI of the brain without contrast shows the following: Small number of T2/FLAIR hyperintense foci in the subcortical and deep white matter consistent with minimal chronic microvascular ischemic change, typical for age.  None of these appear to be acute. Increased susceptibility involving the left basal ganglia (series 10 images 38-45).  This is consistent with remote hemorrhage.  No discrete stroke is noted on other sequences. No acute findings. The internal auditory canals and the mastoid air cells appear normal.  COGNITION: Overall cognitive status: Within functional limits for tasks assessed  POSTURE:  rounded shoulders and forward head  Cervical ROM:  WFL's   BED MOBILITY:  Independent  GAIT: Gait pattern: [facility]  Distance walked: 83' Assistive device utilized: None  PATIENT SURVEYS:  FOTO DPS 54  VESTIBULAR ASSESSMENT:  GENERAL OBSERVATION: pt ambulating independently but mildy unsteady due to c/o vertigo; pt reports episodes have gotten more frequent in occurrence   SYMPTOM BEHAVIOR:  Subjective history: pt reports intermittent episodes of vertigo for past 2 yrs;  pt was referred to this clinic in Feb. 2024 for vestibular rehab; the PT felt further testing from MD was  needed; imaging was done which was unremarkable; pt was referred back to PT in June by Dr. Teresa Coombs  Non-Vestibular symptoms:  N/A  Type of dizziness: Spinning/Vertigo and Unsteady with head/body turns  Frequency: daily  Duration: varies - secs to minutes  Aggravating factors: Induced by position change: lying supine, rolling to the right, and rolling to the left  Relieving factors: head stationary  Progression of symptoms: unchanged   POSITIONAL TESTING: Right Dix-Hallpike: upbeating, right nystagmus Left Dix-Hallpike: upbeating, left nystagmus Left Roll Test: geotropic nystagmus  MOTION SENSITIVITY:  Motion Sensitivity Quotient Intensity: 0 = none, 1 = Lightheaded, 2 = Mild, 3 = Moderate, 4 = Severe, 5 = Vomiting  Intensity  1. Sitting to supine   2. Supine to L side   3. Supine to R side   4. Supine to sitting   5. L Hallpike-Dix 4  6. Up from L  3  7. R Hallpike-Dix 3  8. Up from R  3  9. Sitting, head tipped to L knee   10. Head up from L knee   11. Sitting, head tipped to R knee   12. Head up from R knee   13. Sitting head turns x5   14.Sitting head nods x5   15. In stance, 180 turn to L    16. In stance, 180 turn to R    VESTIBULAR TREATMENT:                                                                                                   DATE: 09-03-23      NeuroRe-ed:    Lt Dix-Hallpike test (-) with no nystagmus and no c/o vertigo in test position; pt did report dizziness with return to upright sitting position  Rt Dix-Hallpike test (+) with Rt rotary upbeating nystagmus and c/o vertigo in test position - indicative of Rt BPPV posterior canalithiasis   Canalith Repositioning:  Epley maneuver for Rt BPPV - 3 reps performed; symptoms much improved on 3rd rep  Reviewed Epley maneuver for self treatment prn (previously issued)     Pt Education (as instructed in session on 06-25-23) - SEE ABOVE   Self Treatment for Right Posterior / Anterior  Canalithiasis    Sitting on bed: 1. Turn head 45 right. (a) Lie back slowly, shoulders on pillow, head on bed. (b) Hold _20-30___ seconds. 2. Keeping head on bed, turn head 90 left. Hold _20-30___ seconds. 3. Roll to left, head on 45 angle down toward bed. Hold _20-30___ seconds. 4. Sit up on left side of bed. Repeat __3__ times per session. Do _2___ sessions per day.  PATIENT EDUCATION: Education details: Teaching laboratory technician - both written instructions & diagram of maneuver, Brandt-Daroff exercise prn for habituation Person educated: Patient, husband Education method: Explanation and Handouts Education comprehension: verbalized understanding  HOME EXERCISE PROGRAM:  to be established  GOALS: Goals reviewed with patient? Yes     LONG TERM GOALS: Target date: 07-24-23;  New Target Date - 09-18-23  Pt will have (-) Lt and Rt Dix-Hallpike tests with no nystagmus and no c/o vertigo in either test position.  Baseline: (+) Lt Dix-Hallpike test on 08-25-23 Goal status: Ongoing  2.  Pt will perform bed mobility without c/o vertigo. Baseline:  Goal status: Ongoing  3.  Improve FOTO score from DPS 54 to >/= 61/100 to demo improvement in vertigo.  Baseline:  Goal status: Ongoing  4.  Independent in HEP for habituation and self treatment prn should BPPV re-occur.  Baseline:  Goal status: Goal met 08-06-23   ASSESSMENT:  CLINICAL IMPRESSION: Pt had (+) Rt Dix-Hallpike test with Rt upbeating rotary nystagmus and c/o vertigo in test position, indicative of Rt BPPV.  Lt BPPV resolved as Lt Dix-Hallpike test (-) with no c/o vertigo in test position.  Symptoms much improved on 3rd rep of Epley.  Will continue to assess and treat prn. Cont with POC.  OBJECTIVE IMPAIRMENTS: decreased balance, difficulty walking, and dizziness.   ACTIVITY LIMITATIONS: bending, squatting, bed mobility, reach over head, and locomotion level  PARTICIPATION LIMITATIONS: meal prep, cleaning, laundry, driving,  shopping, and community activity  PERSONAL FACTORS: Past/current experiences and Time since onset of injury/illness/exacerbation are also affecting patient's functional outcome.   REHAB POTENTIAL: Good  CLINICAL DECISION MAKING: Evolving/moderate complexity  EVALUATION COMPLEXITY: Moderate   PLAN:  PT FREQUENCY: 2x/week  PT DURATION: 4 weeks  PLANNED INTERVENTIONS: 97110-Therapeutic exercises, 97530- Therapeutic activity, [id number]- Neuromuscular re-education, [phone]- Self Care, [id number]- Gait training, [phone]- Canalith repositioning, Patient/Family education, and Vestibular training  PLAN FOR NEXT SESSION: recheck Rt BPPV and treat prn   Burnice Vassel, Donavan Burnet, PT 09/15/2023, 2:54 PM

## 2023-09-16 ENCOUNTER — Encounter: Payer: Self-pay | Admitting: Physical Therapy

## 2023-09-27 IMAGING — MG MM DIGITAL SCREENING BILAT W/ TOMO AND CAD
8 series · 8 of 24 positions shown · non-contrast
Comparison: Previous exam(s).

CLINICAL DATA: Screening.

EXAM:
DIGITAL SCREENING BILATERAL MAMMOGRAM WITH TOMOSYNTHESIS AND CAD
TECHNIQUE: Bilateral screening digital craniocaudal and mediolateral oblique
mammograms were obtained. Bilateral screening digital breast
tomosynthesis was performed. The images were evaluated with
computer-aided detection.

[L CC synth-2D]
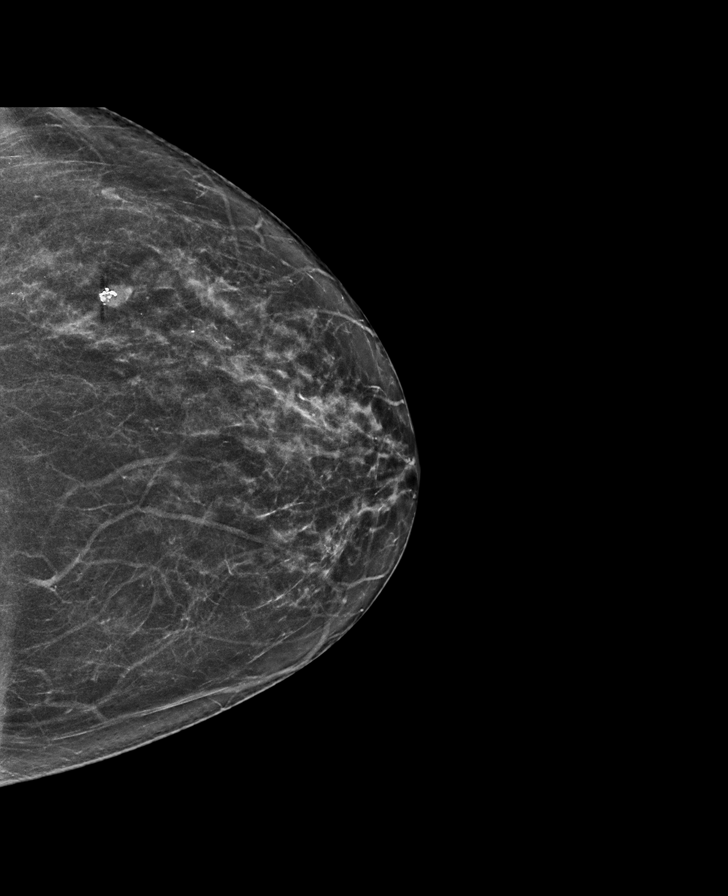

[L MLO synth-2D]
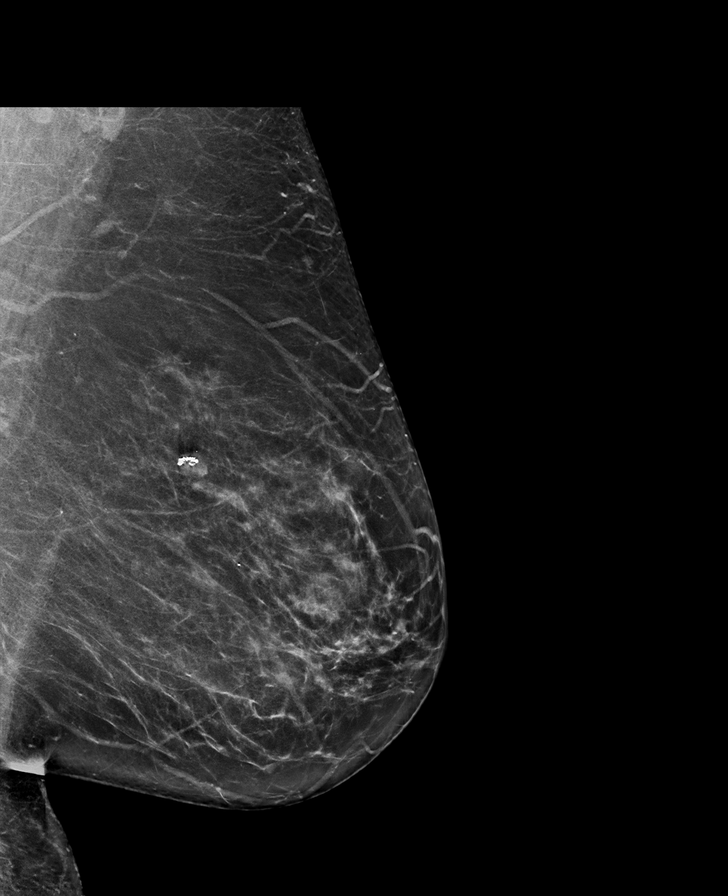

[R MLO synth-2D]
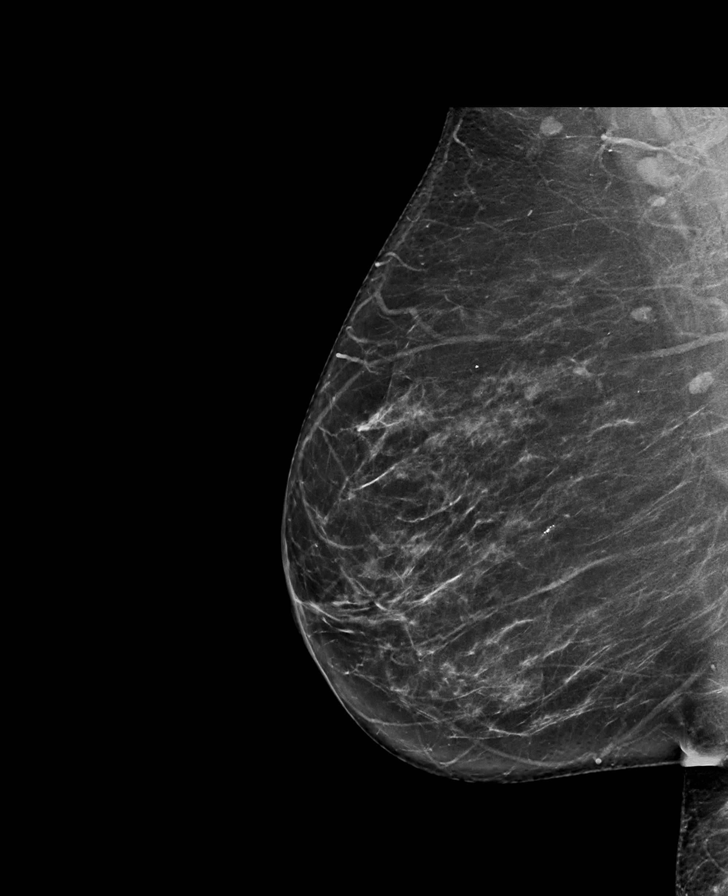

[R CC synth-2D]
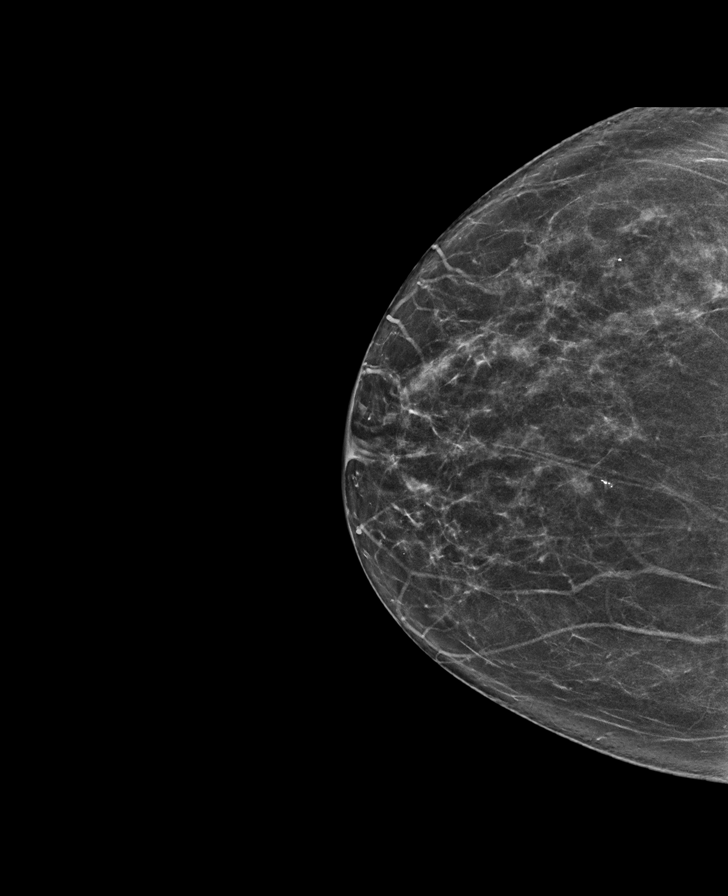

[R CC tomo · tomo slice 33/66.0]
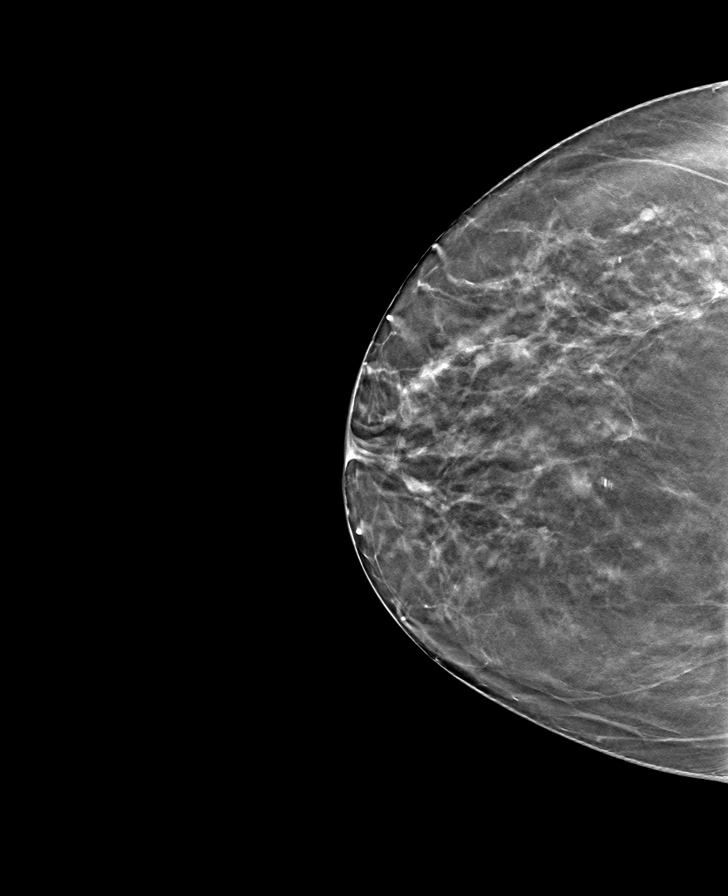

[L MLO tomo · tomo slice 39/77.0]
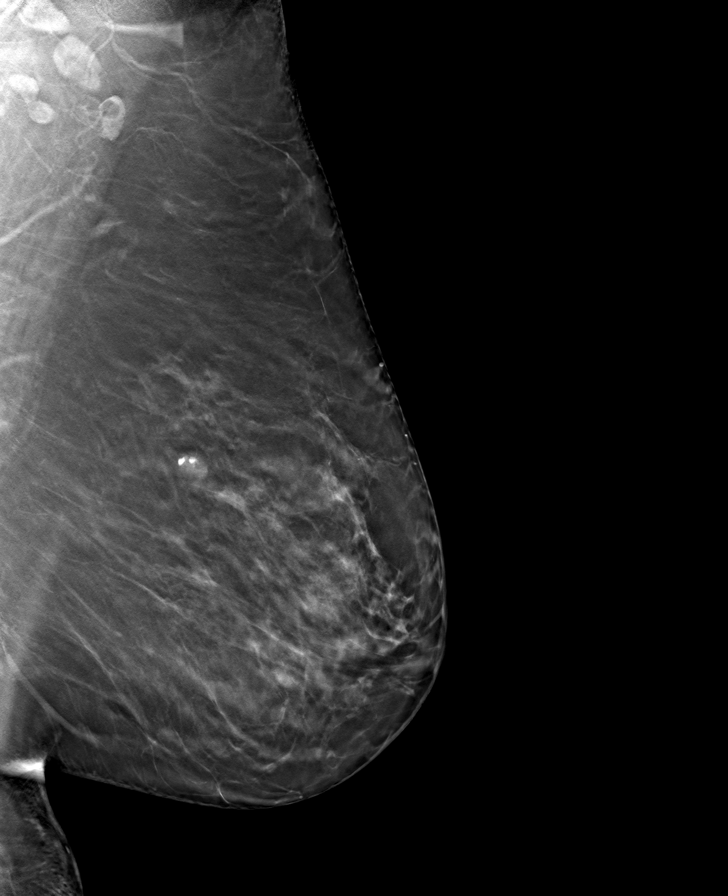

[R MLO tomo · tomo slice 39/77.0]
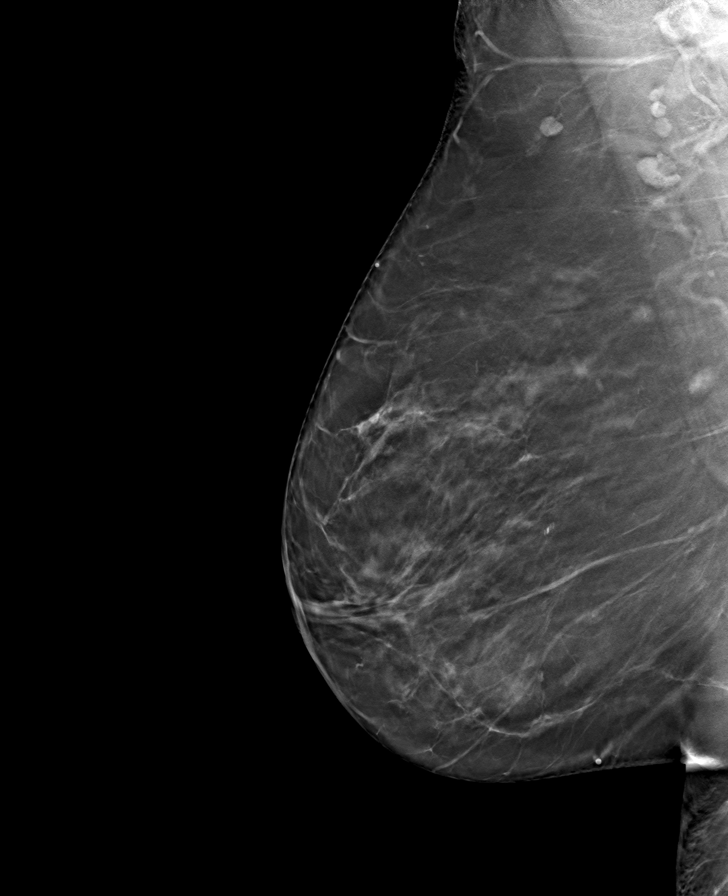

[L CC tomo · tomo slice 35/68.0]
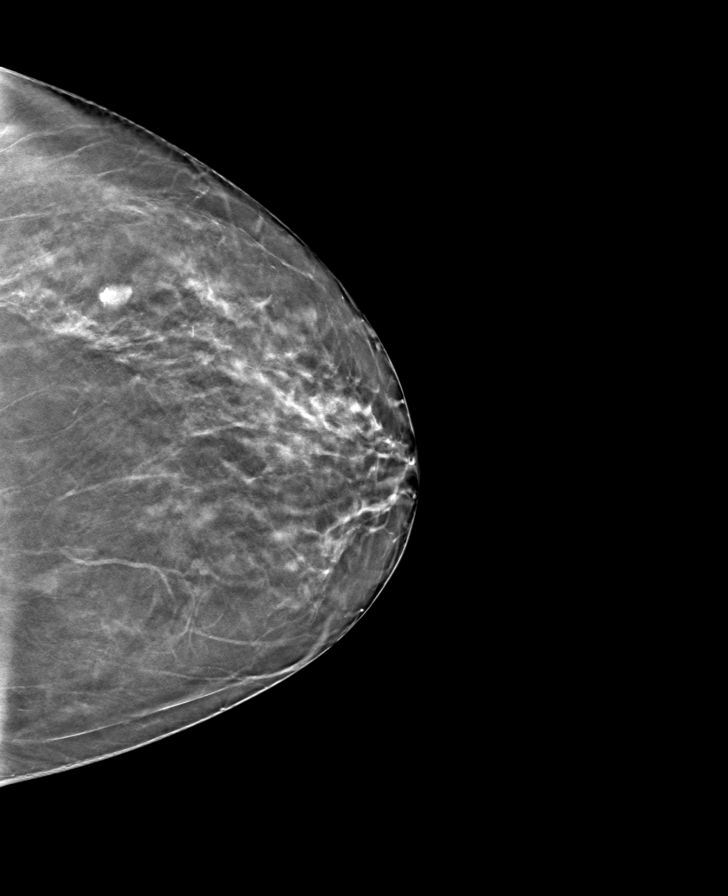

[8 of 24 positions shown; findings below may reference images not displayed]

ACR Breast Density Category b: There are scattered areas of
fibroglandular density.
FINDINGS: There are no findings suspicious for malignancy.
IMPRESSION: No mammographic evidence of malignancy. A result letter of this
screening mammogram will be mailed directly to the patient.

RECOMMENDATION:
Screening mammogram in one year. (Code:51-O-LD2)

BI-RADS CATEGORY  1: Negative.

## 2023-09-29 ENCOUNTER — Ambulatory Visit: Payer: Medicare Other | Attending: Family Medicine | Admitting: Physical Therapy

## 2023-09-29 DIAGNOSIS — H8113 Benign paroxysmal vertigo, bilateral: Secondary | ICD-10-CM | POA: Insufficient documentation
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
## 2023-09-30 ENCOUNTER — Encounter: Payer: Self-pay | Admitting: Physical Therapy

## 2023-10-14 DIAGNOSIS — Z0000 Encounter for general adult medical examination without abnormal findings: Secondary | ICD-10-CM | POA: Diagnosis not present

## 2023-10-14 DIAGNOSIS — E7800 Pure hypercholesterolemia, unspecified: Secondary | ICD-10-CM | POA: Diagnosis not present

## 2023-10-14 DIAGNOSIS — Z1331 Encounter for screening for depression: Secondary | ICD-10-CM | POA: Diagnosis not present

## 2023-10-14 DIAGNOSIS — Z23 Encounter for immunization: Secondary | ICD-10-CM | POA: Diagnosis not present

## 2023-10-15 ENCOUNTER — Other Ambulatory Visit: Payer: Self-pay | Admitting: Family Medicine

## 2023-10-15 DIAGNOSIS — M810 Age-related osteoporosis without current pathological fracture: Secondary | ICD-10-CM

## 2023-10-22 ENCOUNTER — Ambulatory Visit
Admission: RE | Admit: 2023-10-22 | Discharge: 2023-10-22 | Disposition: A | Discharge status: Home / Self Care | Payer: Medicare Other | Source: Ambulatory Visit | Attending: Family Medicine | Admitting: Family Medicine

## 2023-10-22 DIAGNOSIS — M810 Age-related osteoporosis without current pathological fracture: Secondary | ICD-10-CM

## 2023-10-26 DIAGNOSIS — M810 Age-related osteoporosis without current pathological fracture: Secondary | ICD-10-CM | POA: Diagnosis not present

## 2023-10-27 ENCOUNTER — Ambulatory Visit
Admission: RE | Admit: 2023-10-27 | Discharge: 2023-10-27 | Disposition: A | Discharge status: Home / Self Care | Payer: Medicare Other | Source: Ambulatory Visit | Attending: Family Medicine | Admitting: Family Medicine

## 2023-10-27 DIAGNOSIS — N958 Other specified menopausal and perimenopausal disorders: Secondary | ICD-10-CM | POA: Diagnosis not present

## 2023-10-27 DIAGNOSIS — M8588 Other specified disorders of bone density and structure, other site: Secondary | ICD-10-CM | POA: Diagnosis not present

## 2023-10-27 DIAGNOSIS — E2839 Other primary ovarian failure: Secondary | ICD-10-CM | POA: Diagnosis not present

## 2024-01-01 ENCOUNTER — Other Ambulatory Visit: Payer: Self-pay | Admitting: Family Medicine

## 2024-01-01 DIAGNOSIS — Z1231 Encounter for screening mammogram for malignant neoplasm of breast: Secondary | ICD-10-CM

## 2024-02-16 DIAGNOSIS — K080 Exfoliation of teeth due to systemic causes: Secondary | ICD-10-CM | POA: Diagnosis not present

## 2024-02-22 DIAGNOSIS — M810 Age-related osteoporosis without current pathological fracture: Secondary | ICD-10-CM | POA: Diagnosis not present

## 2024-02-22 DIAGNOSIS — E7800 Pure hypercholesterolemia, unspecified: Secondary | ICD-10-CM | POA: Diagnosis not present

## 2024-02-29 DIAGNOSIS — K080 Exfoliation of teeth due to systemic causes: Secondary | ICD-10-CM | POA: Diagnosis not present

## 2024-03-31 ENCOUNTER — Ambulatory Visit
Admission: RE | Admit: 2024-03-31 | Discharge: 2024-03-31 | Disposition: A | Discharge status: Home / Self Care | Source: Ambulatory Visit | Attending: Family Medicine | Admitting: Family Medicine

## 2024-03-31 DIAGNOSIS — Z1231 Encounter for screening mammogram for malignant neoplasm of breast: Secondary | ICD-10-CM | POA: Diagnosis not present

## 2024-04-08 DIAGNOSIS — H6991 Unspecified Eustachian tube disorder, right ear: Secondary | ICD-10-CM | POA: Diagnosis not present

## 2024-04-24 DIAGNOSIS — M810 Age-related osteoporosis without current pathological fracture: Secondary | ICD-10-CM | POA: Diagnosis not present

## 2024-04-24 DIAGNOSIS — E7800 Pure hypercholesterolemia, unspecified: Secondary | ICD-10-CM | POA: Diagnosis not present

## 2024-04-28 DIAGNOSIS — M810 Age-related osteoporosis without current pathological fracture: Secondary | ICD-10-CM | POA: Diagnosis not present

## 2024-05-24 DIAGNOSIS — E7800 Pure hypercholesterolemia, unspecified: Secondary | ICD-10-CM | POA: Diagnosis not present

## 2024-05-24 DIAGNOSIS — M810 Age-related osteoporosis without current pathological fracture: Secondary | ICD-10-CM | POA: Diagnosis not present

## 2024-06-24 DIAGNOSIS — E7800 Pure hypercholesterolemia, unspecified: Secondary | ICD-10-CM | POA: Diagnosis not present

## 2024-06-24 DIAGNOSIS — M810 Age-related osteoporosis without current pathological fracture: Secondary | ICD-10-CM | POA: Diagnosis not present

## 2024-07-24 DIAGNOSIS — M810 Age-related osteoporosis without current pathological fracture: Secondary | ICD-10-CM | POA: Diagnosis not present

## 2024-07-24 DIAGNOSIS — E7800 Pure hypercholesterolemia, unspecified: Secondary | ICD-10-CM | POA: Diagnosis not present
